# Patient Record
Sex: Female | Born: 1955
Health system: Southern US, Community
[De-identification: ages and names within clinical notes are randomized; demographics above are authoritative.]

## PROBLEM LIST (undated history)

## (undated) DIAGNOSIS — E079 Disorder of thyroid, unspecified: Secondary | ICD-10-CM

## (undated) DIAGNOSIS — G43109 Migraine with aura, not intractable, without status migrainosus: Secondary | ICD-10-CM

## (undated) DIAGNOSIS — E119 Type 2 diabetes mellitus without complications: Secondary | ICD-10-CM

## (undated) DIAGNOSIS — I10 Essential (primary) hypertension: Secondary | ICD-10-CM

## (undated) DIAGNOSIS — E785 Hyperlipidemia, unspecified: Secondary | ICD-10-CM

## (undated) DIAGNOSIS — F419 Anxiety disorder, unspecified: Secondary | ICD-10-CM

## (undated) DIAGNOSIS — F32A Depression, unspecified: Secondary | ICD-10-CM

## (undated) DIAGNOSIS — F329 Major depressive disorder, single episode, unspecified: Secondary | ICD-10-CM

## (undated) HISTORY — DX: Essential (primary) hypertension: I10

## (undated) HISTORY — PX: CHOLECYSTECTOMY: SHX55

## (undated) HISTORY — DX: Major depressive disorder, single episode, unspecified: F32.9

## (undated) HISTORY — DX: Hyperlipidemia, unspecified: E78.5

## (undated) HISTORY — DX: Depression, unspecified: F32.A

## (undated) HISTORY — DX: Migraine with aura, not intractable, without status migrainosus: G43.109

## (undated) HISTORY — DX: Disorder of thyroid, unspecified: E07.9

## (undated) HISTORY — DX: Type 2 diabetes mellitus without complications: E11.9

## (undated) HISTORY — PX: BREAST SURGERY: SHX581

## (undated) HISTORY — DX: Anxiety disorder, unspecified: F41.9

## (undated) HISTORY — PX: THYROIDECTOMY, PARTIAL: SHX18

---

## 2016-01-01 ENCOUNTER — Encounter: Payer: Self-pay | Admitting: Family Medicine

## 2016-01-01 ENCOUNTER — Ambulatory Visit (INDEPENDENT_AMBULATORY_CARE_PROVIDER_SITE_OTHER): Payer: BLUE CROSS/BLUE SHIELD | Admitting: Family Medicine

## 2016-01-01 VITALS — BP 135/70 | HR 83 | Resp 12 | Ht 64.5 in | Wt 291.5 lb

## 2016-01-01 DIAGNOSIS — I10 Essential (primary) hypertension: Secondary | ICD-10-CM | POA: Diagnosis not present

## 2016-01-01 DIAGNOSIS — E559 Vitamin D deficiency, unspecified: Secondary | ICD-10-CM

## 2016-01-01 DIAGNOSIS — Z6841 Body Mass Index (BMI) 40.0 and over, adult: Secondary | ICD-10-CM

## 2016-01-01 DIAGNOSIS — E118 Type 2 diabetes mellitus with unspecified complications: Secondary | ICD-10-CM

## 2016-01-01 DIAGNOSIS — E890 Postprocedural hypothyroidism: Secondary | ICD-10-CM

## 2016-01-01 DIAGNOSIS — F419 Anxiety disorder, unspecified: Secondary | ICD-10-CM

## 2016-01-01 DIAGNOSIS — E1165 Type 2 diabetes mellitus with hyperglycemia: Secondary | ICD-10-CM | POA: Diagnosis not present

## 2016-01-01 DIAGNOSIS — IMO0002 Reserved for concepts with insufficient information to code with codable children: Secondary | ICD-10-CM

## 2016-01-01 DIAGNOSIS — F329 Major depressive disorder, single episode, unspecified: Secondary | ICD-10-CM | POA: Insufficient documentation

## 2016-01-01 DIAGNOSIS — G43109 Migraine with aura, not intractable, without status migrainosus: Secondary | ICD-10-CM

## 2016-01-01 DIAGNOSIS — Z794 Long term (current) use of insulin: Secondary | ICD-10-CM | POA: Diagnosis not present

## 2016-01-01 DIAGNOSIS — E785 Hyperlipidemia, unspecified: Secondary | ICD-10-CM | POA: Diagnosis not present

## 2016-01-01 DIAGNOSIS — G4733 Obstructive sleep apnea (adult) (pediatric): Secondary | ICD-10-CM | POA: Insufficient documentation

## 2016-01-01 DIAGNOSIS — Z9989 Dependence on other enabling machines and devices: Secondary | ICD-10-CM

## 2016-01-01 DIAGNOSIS — F418 Other specified anxiety disorders: Secondary | ICD-10-CM

## 2016-01-01 DIAGNOSIS — F32A Depression, unspecified: Secondary | ICD-10-CM | POA: Insufficient documentation

## 2016-01-01 LAB — LIPID PANEL
Cholesterol: 166 mg/dL (ref 0–200)
HDL: 42.7 mg/dL (ref >39.00)
LDL Cholesterol: 93 mg/dL (ref 0–99)
NonHDL: 123.35
TRIGLYCERIDES: 151 mg/dL — AB (ref 0.0–149.0)
Total CHOL/HDL Ratio: 4
VLDL: 30.2 mg/dL (ref 0.0–40.0)

## 2016-01-01 LAB — CBC
HCT: 36.8 % (ref 36.0–46.0)
HEMOGLOBIN: 12.3 g/dL (ref 12.0–15.0)
MCHC: 33.5 g/dL (ref 30.0–36.0)
MCV: 88.5 fl (ref 78.0–100.0)
PLATELETS: 283 10*3/uL (ref 150.0–400.0)
RBC: 4.16 Mil/uL (ref 3.87–5.11)
RDW: 14.4 % (ref 11.5–15.5)
WBC: 9 10*3/uL (ref 4.0–10.5)

## 2016-01-01 LAB — COMPREHENSIVE METABOLIC PANEL
ALT: 28 U/L (ref 0–35)
AST: 20 U/L (ref 0–37)
Albumin: 4 g/dL (ref 3.5–5.2)
Alkaline Phosphatase: 115 U/L (ref 39–117)
BUN: 12 mg/dL (ref 6–23)
CHLORIDE: 99 meq/L (ref 96–112)
CO2: 28 meq/L (ref 19–32)
Calcium: 9.2 mg/dL (ref 8.4–10.5)
Creatinine, Ser: 0.76 mg/dL (ref 0.40–1.20)
GFR: 82.12 mL/min (ref >60.00)
GLUCOSE: 223 mg/dL — AB (ref 70–99)
POTASSIUM: 4.2 meq/L (ref 3.5–5.1)
Sodium: 135 mEq/L (ref 135–145)
Total Bilirubin: 0.4 mg/dL (ref 0.2–1.2)
Total Protein: 7.2 g/dL (ref 6.0–8.3)

## 2016-01-01 LAB — MICROALBUMIN / CREATININE URINE RATIO
CREATININE, U: 241.3 mg/dL
MICROALB/CREAT RATIO: 1.7 mg/g (ref 0.0–30.0)
Microalb, Ur: 4 mg/dL — ABNORMAL HIGH (ref 0.0–1.9)

## 2016-01-01 LAB — HEMOGLOBIN A1C: Hgb A1c MFr Bld: 10.7 % — ABNORMAL HIGH (ref 4.6–6.5)

## 2016-01-01 LAB — TSH: TSH: 3.76 u[IU]/mL (ref 0.35–4.50)

## 2016-01-01 LAB — VITAMIN D 25 HYDROXY (VIT D DEFICIENCY, FRACTURES): VITD: 16.62 ng/mL — ABNORMAL LOW (ref 30.00–100.00)

## 2016-01-01 LAB — T4, FREE: FREE T4: 0.76 ng/dL (ref 0.60–1.60)

## 2016-01-01 MED ORDER — AMITRIPTYLINE HCL 10 MG PO TABS: 30.0000 mg | ORAL_TABLET | Freq: Every day | ORAL | 5 refills | Status: DC

## 2016-01-01 MED ORDER — CITALOPRAM HYDROBROMIDE 40 MG PO TABS: 40.0000 mg | ORAL_TABLET | Freq: Every day | ORAL | 3 refills | Status: DC

## 2016-01-01 MED ORDER — ATORVASTATIN CALCIUM 40 MG PO TABS: 40.0000 mg | ORAL_TABLET | Freq: Every day | ORAL | 3 refills | Status: DC

## 2016-01-01 MED ORDER — LISINOPRIL-HYDROCHLOROTHIAZIDE 20-12.5 MG PO TABS: 1.0000 | ORAL_TABLET | Freq: Every day | ORAL | 1 refills | Status: DC

## 2016-01-01 NOTE — Patient Instructions (Addendum)
A few things to remember from today's visit:   Uncontrolled type 2 diabetes mellitus with complication, with long-term current use of insulin (HCC) - Plan: Lipid panel, CMP, Hemoglobin A1c, Microalbumin/Creatinine Ratio, Urine  Essential hypertension - Plan: Lipid panel, CMP, CBC, lisinopril-hydrochlorothiazide (PRINZIDE,ZESTORETIC) 20-12.5 MG tablet  Hyperlipidemia - Plan: Lipid panel, CMP, atorvastatin (LIPITOR) 40 MG tablet  Postsurgical hypothyroidism - Plan: TSH, T4, Free, CBC  Anxiety and depression - Plan: citalopram (CELEXA) 40 MG tablet  BMI 45.0-49.9, adult (HCC) - Plan: CMP  Vitamin D deficiency - Plan: VITAMIN D 25 Hydroxy (Vit-D Deficiency, Fractures)  OSA on CPAP - Plan: Ambulatory referral to Pulmonology  Migraine with aura and without status migrainosus, not intractable - Plan: amitriptyline (ELAVIL) 10 MG tablet  HgA1C goal < 7.0. Avoid sugar added food:regular soft drinks, energy drinks, and sports drinks. candy. cakes. cookies. pies and cobblers. sweet rolls, pastries, and donuts. fruit drinks, such as fruitades and fruit punch. dairy desserts, such as ice cream  Mediterranean diet has showed benefits for sugar control.  How much and what type of carbohydrate foods are important for managing diabetes. The balance between how much insulin is in your body and the carbohydrate you eat makes a difference in your blood glucose levels.  Fasting blood sugar ideally 130 or less, 2 hours after meals less than 180.   Regular exercise also will help with controlling disease, daily brisk walking as tolerated for 15-30 min definitively will help.   Avoid skipping meals, blood sugar might drop and cause serious problems. Remember checking feet periodically, good dental hygiene, and annual eye exam.  What are some tips for weight loss?   People become overweight for many reasons. Weight issues can run in families. They can be caused by unhealthy behaviors and a person's  environment. Certain health problems and medicines can also lead to weight gain. There are some simple things you can do to reach and maintain a healthy weight:  Eat small more frequent healthy meals instead 3 bid meals. Also Weight Watchers is a good option. Avoid sweet drinks. These include regular soft drinks, fruit juices, fruit drinks, energy drinks, sweetened iced tea, and flavored milk. Avoid fast foods. Fast foods such as french fries, hamburgers, chicken nuggets, and pizza are high in calories and can cause weight gain. Eat a healthy breakfast. People who skip breakfast tend to weigh more. Don't watch more than two hours of television per day. Chew sugar-free gum between meals to cut down on snacking. Avoid grocery shopping when you're hungry. Pack a healthy lunch instead of eating out to control what and how much you eat. Eat a lot of fruits and vegetables. Aim for about 2 cups of fruit and 2 to 3 cups of vegetables per day. Aim for 150 minutes per week of moderate-intensity exercise (such as brisk walking), or 75 minutes per week of vigorous exercise (such as jogging or running). OR 15-30 min of daily brisk walking. Be more active. Small changes in physical activity can easily be added to your daily routine. For example, take the stairs instead of the elevator. Take a walk with your family. A daily walk is a great way to get exercise and to catch up on the day's events.     Please be sure medication list is accurate. If a new problem present, please set up appointment sooner than planned today.

## 2016-01-01 NOTE — Progress Notes (Signed)
HPI:   [name] is a 60 y.o. female, who is here today to establish care with me.  Former PCP: In [place] Last preventive routine visit: within a year ago. She follows with gyn regularly for her female preventive care and treatment of vulvar lichen sclerotic.  Concerns today: meds refills and labs, she has not eaten anything today.    Diabetes Mellitus II:   Sheis currently on Novolin 45 U am and 35 U pm, Metformin 1000 mg bid, and Glimepiride 4 mg bid. Problem has been stable. She is checking BS's, denies any hypoglycemia.Lower BS 87, rest 200's. She is tolerating medications well. She denies abdominal pain, nausea, vomiting, polydipsia, polyuria, or polyphagia. No numbness or burning but occasional tingling on toes.  Last eye exam 08/2015. Has a dental appt coming.    Hyperlipidemia: She is on Atorvastatin 40 mg daily. Following a low fat diet, not consistently.. She has not noted side effects with medication.    Hypertension:  Years Hx.  Currently she is on Lisinopril-HCTZ. She is taking medications as instructed, no side effects reported.  She has not noted unusual headache, visual changes, exertional chest pain, dyspnea,  focal weakness, or edema.  + OSA on CPAP.  She just started exercising a few months ago, she has a Psychologist, educational. Also recently she started a healthy diet, similar to Weight Watchers, on line couching and attending meetings.  Hypothyroidism:  Currently she is on Levothyroxine 112 mcg daily. S/P partial thyroidectomy dur to thyroid cancer.She has followed with PCP.  Tolerating medication well, no side effects reported. She has not noted dysphagia, palpitations, abdominal pain, changes in bowel habits, tremor, cold/heat intolerance, or abnormal weight loss.  Vit D deficiency on OTC Vit D 1000 U daily. Also reporting Hx of iron deficiency anemia. Colonoscopy 1.5 years ago, due at age 59.   She also has history of migraines with  aura, currently she is on amitriptyline 30 mg daily, which she has taken for about 7 years. She has an episode per year since she's been on the Amitriptyline. Headache is usually associated with photophobia, nausea, vomiting, preceded by visual aura and aphasia.  GERD and esophageal spasms, she is currently on Pantoprazole 40 mg daily, she's been taking it since 1982.    History of anxiety and depression, she denies any history of bipolar disorder or psychotic hospitalization. Currently she is on Citalopram 40 mg daily, which should be taken for 5 years. Denies depressed mood or suicidal thoughts.  FHx negative for bipolar disorder. Son with Hx of anxiety and depression.  She lives with son.  Reporting labs done last about 9 months ago.   Review of Systems  Constitutional: Negative for activity change, appetite change, fatigue, fever and unexpected weight change.  HENT: Negative for dental problem, mouth sores, nosebleeds, sore throat and trouble swallowing.   Eyes: Negative for redness and visual disturbance.  Respiratory: Negative for cough, shortness of breath and wheezing.        + OSA on CPAP. + Smoker.  Cardiovascular: Negative for chest pain, palpitations and leg swelling.  Gastrointestinal: Negative for abdominal pain, nausea and vomiting.       Negative for changes in bowel habits.  Endocrine: Negative for cold intolerance, heat intolerance, polydipsia, polyphagia and polyuria.  Genitourinary: Negative for decreased urine volume, difficulty urinating and hematuria.  Musculoskeletal: Negative for gait problem and myalgias.  Skin: Negative for pallor and rash.  Neurological: Negative for seizures, syncope, weakness, numbness and  headaches.  Psychiatric/Behavioral: Negative for confusion and suicidal ideas. The patient is nervous/anxious.       No current outpatient prescriptions on file prior to visit.   No current facility-administered medications on file prior to  visit.      Past Medical History:  Diagnosis Date  . Anxiety   . Depression   . Diabetes mellitus without complication (HCC)   . Hyperlipidemia   . Hypertension   . Migraine headache with aura   . Thyroid disease    Not on File  Family History  Problem Relation Age of Onset  . Breast cancer Mother   . Stroke Father   . Heart disease Father   . Hyperlipidemia Father   . Cancer - Lung Maternal Grandfather     Social History   Social History  . Marital status: Unknown    Spouse name: N/A  . Number of children: N/A  . Years of education: N/A   Social History Main Topics  . Smoking status: Current Every Day Smoker  . Smokeless tobacco: None  . Alcohol use Yes     Comment: occasional  . Drug use: No  . Sexual activity: Not Asked   Other Topics Concern  . None   Social History Narrative  . None    Vitals:   01/01/16 1355  BP: 135/70  Pulse: 83  Resp: 12    Body mass index is 49.26 kg/m.  O2 sat 97% at [facility].    Physical Exam  Nursing note and vitals reviewed. Constitutional: She is oriented to person, place, and time. She appears well-developed. No distress.  HENT:  Head: Atraumatic.  Mouth/Throat: Oropharynx is clear and moist and mucous membranes are normal.  Eyes: Conjunctivae and EOM are normal. Pupils are equal, round, and reactive to light.  Neck: No thyroid mass and no thyromegaly present.  Cardiovascular: Normal rate and regular rhythm.   No murmur heard. Pulses:      Dorsalis pedis pulses are 2+ on the right side, and 2+ on the left side.  Respiratory: Effort normal and breath sounds normal. No respiratory distress.  GI: Soft. She exhibits no mass. There is no hepatomegaly. There is no tenderness.  Musculoskeletal: She exhibits no edema.  Lymphadenopathy:    She has no cervical adenopathy.  Neurological: She is alert and oriented to person, place, and time. She has normal strength. Coordination and gait normal.  Skin: Skin is warm. No  erythema.  Psychiatric: She has a normal mood and affect.  Well groomed, good eye contact.   Diabetic foot exam:  Monofilament normal bilateral. Peripheral pulses present (DP). No calluses No hypertrophic/long toenails.    ASSESSMENT AND PLAN:     Julie Black was seen today for new patient (initial visit).  Diagnoses and all orders for this visit:  Uncontrolled type 1 diabetes mellitus with complication (HCC)  No changes in current management base on HgA1C I will change Novolin to basal insulin. Rest of meds to continue. Foot, dental, and eye care discussed. F/U in 4 months.  -     Lipid panel -     CMP -     Hemoglobin A1c -     Microalbumin/Creatinine Ratio, Urine  Essential hypertension   Adequately controlled. No changes in current management. DASH/low salt diet recommended. Eye exam recommended annually. F/U in 4 months, before if needed.  -     Lipid panel -     CMP -     CBC -  lisinopril-hydrochlorothiazide (PRINZIDE,ZESTORETIC) 20-12.5 MG tablet; Take 1 tablet by mouth daily.  Hyperlipidemia  No changes in current management, will follow labs done today and will give further recommendations accordingly. Low fat diet. F/U in 6-12 months. -     Lipid panel -     CMP -     atorvastatin (LIPITOR) 40 MG tablet; Take 1 tablet (40 mg total) by mouth daily.  Postsurgical hypothyroidism  No changes in current management, will follow labs done today and will give further recommendations accordingly. F/U in 6-12 months.   -     TSH -     T4, Free -     CBC  Anxiety and depression  Stable. No changes in current management. We may try to decrease dose of Celexa in a few months. F/U in 4  months.  -     citalopram (CELEXA) 40 MG tablet; Take 1 tablet (40 mg total) by mouth daily.  BMI 45.0-49.9, adult (HCC)  We discussed benefits of wt loss as well as adverse effects of obesity. Consistency with healthy diet and physical activity  recommended. Daily brisk walking for 15-30 min as tolerated is a good option if difficulty going to the gym. Continue current diet. F/U in 4 months.  -     CMP  Vitamin D deficiency  No changes in current management, will follow labs done today and will give further recommendations accordingly.  -     VITAMIN D 25 Hydroxy (Vit-D Deficiency, Fractures)  OSA on CPAP -     Ambulatory referral to Pulmonology  Migraine with aura and without status migrainosus, not intractable  Stable. No changes in current management. F/U in 6-12 months.  -     amitriptyline (ELAVIL) 10 MG tablet; Take 3 tablets (30 mg total) by mouth at bedtime.          Dorena Dorfman G. [place], MD  [facility]. [place] office.

## 2016-01-03 ENCOUNTER — Other Ambulatory Visit: Payer: Self-pay

## 2016-01-03 MED ORDER — LEVOTHYROXINE SODIUM 112 MCG PO TABS: 112.0000 ug | ORAL_TABLET | Freq: Every day | ORAL | 1 refills | Status: DC

## 2016-01-03 MED ORDER — INSULIN PEN NEEDLE 32G X 4 MM MISC: 2 refills | Status: DC

## 2016-01-03 MED ORDER — INSULIN GLARGINE 100 UNIT/ML SOLOSTAR PEN: PEN_INJECTOR | SUBCUTANEOUS | 2 refills | Status: DC

## 2016-01-03 MED ORDER — METFORMIN HCL 1000 MG PO TABS: 1000.0000 mg | ORAL_TABLET | Freq: Two times a day (BID) | ORAL | 2 refills | Status: DC

## 2016-01-03 MED ORDER — PANTOPRAZOLE SODIUM 40 MG PO TBEC: 40.0000 mg | DELAYED_RELEASE_TABLET | Freq: Every day | ORAL | 3 refills | Status: DC

## 2016-01-03 MED ORDER — GLIMEPIRIDE 4 MG PO TABS: 4.0000 mg | ORAL_TABLET | Freq: Two times a day (BID) | ORAL | 3 refills | Status: DC

## 2016-02-29 ENCOUNTER — Ambulatory Visit: Payer: Self-pay | Admitting: Internal Medicine

## 2016-03-05 ENCOUNTER — Encounter: Payer: Self-pay | Admitting: Pulmonary Disease

## 2016-03-05 ENCOUNTER — Ambulatory Visit (INDEPENDENT_AMBULATORY_CARE_PROVIDER_SITE_OTHER): Payer: BLUE CROSS/BLUE SHIELD | Admitting: Pulmonary Disease

## 2016-03-05 DIAGNOSIS — G4733 Obstructive sleep apnea (adult) (pediatric): Secondary | ICD-10-CM | POA: Diagnosis not present

## 2016-03-05 DIAGNOSIS — Z9989 Dependence on other enabling machines and devices: Secondary | ICD-10-CM

## 2016-03-05 NOTE — Assessment & Plan Note (Signed)
Please ask your previous DME to send us a copy of her sleep study & report on your CPAP machine   We will set you up with new DMEand provide you with CPAP supplies  Weight loss encouraged, compliance with goal of at least 4-6 hrs every night is the expectation. Advised against medications with sedative side effects Cautioned against driving when sleepy - understanding that sleepiness will vary on a day to day basis

## 2016-03-05 NOTE — Progress Notes (Signed)
Subjective:    Patient ID: Julie Black, female    DOB: Jan 09, 1956, 60 y.o.   MRN: [id number]  HPI  Chief Complaint  Patient presents with  . Advice Only    Sleep consult referred by Dr. Martin[place].  Pt currently on cpap.  Epworth score: 61.     60 year old smoker  Presents to establish care for OSA. SHe was diagnosed many years ago in [place], [place] by an overnight polysomnogram and has been maintained on CPAP 10 cm with a full face mask since then. She is on her second machine for the last 6 years. She has just moved to  [facility] and presents to establish care. 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  Epworth sleep score is 5 and she denies excessive daytime  Somnolence. No snoring has been  By family members while on CPAP>  Bedtime is around midnight sleep latency is minimal-she occasionally has bouts of insomnoia, she sleeps on her right side with 2 pillows, reports one to 2 nocturnal awakenings and is out of bed by 8 AM feeling rested ,with occasional dryness of mouth in spite off using herr humidifier HER-2 dogs sleep with her in bed.  There is no history suggestive of cataplexy, sleep paralysis or parasomnias   she denies problems with mask or pressure She would like to establish with a  New DME to his obtained supplies -download report is not available at this time       Past Medical History:  Diagnosis Date  . Anxiety   . Depression   . Diabetes mellitus without complication ([place])   . Hyperlipidemia   . Hypertension   . Migraine headache with aura   . Thyroid disease      Past Surgical History:  Procedure Laterality Date  . BREAST SURGERY    . CHOLECYSTECTOMY    . THYROIDECTOMY, PARTIAL Right      No Known Allergies   Social History   Social History  . Marital status: Widowed    Spouse name: N/A  . Number of children: N/A  . Years of education: N/A   Occupational History  . Not on file.    Social History Main Topics  . Smoking status: Current Every Day Smoker    Packs/day: 0.25    Years: 3.00    Types: Cigarettes  . Smokeless tobacco: Never Used  . Alcohol use Yes     Comment: occasional  . Drug use: No  . Sexual activity: Not on file   Other Topics Concern  . Not on file   Social History Narrative  . No narrative on file     Family History  Problem Relation Age of Onset  . Breast cancer Mother   . Stroke Father   . Heart disease Father   . Hyperlipidemia Father   . Cancer - Lung Maternal Grandfather      Review of Systems  Constitutional: Positive for fatigue. Negative for fever and unexpected weight change.  HENT: Negative for congestion, dental problem, ear pain, nosebleeds, postnasal drip, rhinorrhea, sinus pressure, sneezing, sore throat and trouble swallowing.   Eyes: Negative for redness and itching.  Respiratory: Negative for cough, chest tightness, shortness of breath and wheezing.   Cardiovascular: Negative for palpitations and leg swelling.  Gastrointestinal: Negative for nausea and vomiting.  Genitourinary: Negative for dysuria.  Musculoskeletal: Negative for joint swelling.  Skin: Negative for rash.  Neurological: Negative for headaches.  Hematological: Does  not bruise/bleed easily.  Psychiatric/Behavioral: Negative for dysphoric mood. The patient is not nervous/anxious.        Objective:   Physical Exam  Gen. Pleasant, obese, in no distress, normal affect ENT - no lesions, no post nasal drip, class 2-3 airway Neck: No JVD, no thyromegaly, no carotid bruits Lungs: no use of accessory muscles, no dullness to percussion, decreased without rales or rhonchi  Cardiovascular: Rhythm regular, heart sounds  normal, no murmurs or gallops, no peripheral edema Abdomen: soft and non-tender, no hepatosplenomegaly, BS normal. Musculoskeletal: No deformities, no cyanosis or clubbing Neuro:  alert, non focal, no tremors       Assessment &  Plan:

## 2016-03-05 NOTE — Patient Instructions (Signed)
Please ask your previous DME to send us a copy of her sleep study & report on your CPAP machine   We will set you up with new DMEand provide you with CPAP supplies

## 2016-03-08 ENCOUNTER — Telehealth: Payer: Self-pay | Admitting: Pulmonary Disease

## 2016-03-08 NOTE — Telephone Encounter (Signed)
Spoke with pt. She is wanting us to call her former doctor to get the records RA is requesting. Advised her that they are going to require a records release from her. States that she is going to be faxing this over.

## 2016-03-19 ENCOUNTER — Telehealth: Payer: Self-pay | Admitting: Pulmonary Disease

## 2016-03-19 NOTE — Telephone Encounter (Signed)
Review of records 04/2010 PSG moderate OSA with RDI 19/hour lowest desaturation 83% 04/2010 CPAP Average pressure 16 cm on auto CPAP downloads

## 2016-04-01 ENCOUNTER — Encounter: Payer: Self-pay | Admitting: Pulmonary Disease

## 2016-04-18 ENCOUNTER — Telehealth: Payer: Self-pay | Admitting: Family Medicine

## 2016-04-18 MED ORDER — INSULIN GLARGINE 100 UNIT/ML SOLOSTAR PEN: PEN_INJECTOR | SUBCUTANEOUS | 2 refills | Status: DC

## 2016-04-18 NOTE — Telephone Encounter (Signed)
Rx sent 

## 2016-04-18 NOTE — Telephone Encounter (Signed)
Pt need new Rx for Lantus    Pharm:  [facility]  [employer]

## 2016-05-02 ENCOUNTER — Ambulatory Visit: Payer: BLUE CROSS/BLUE SHIELD | Admitting: Family Medicine

## 2016-05-17 ENCOUNTER — Ambulatory Visit (INDEPENDENT_AMBULATORY_CARE_PROVIDER_SITE_OTHER): Payer: BLUE CROSS/BLUE SHIELD | Admitting: Family Medicine

## 2016-05-17 ENCOUNTER — Encounter: Payer: Self-pay | Admitting: Family Medicine

## 2016-05-17 VITALS — BP 120/70 | HR 86 | Resp 12 | Ht 64.5 in | Wt 293.2 lb

## 2016-05-17 DIAGNOSIS — E108 Type 1 diabetes mellitus with unspecified complications: Secondary | ICD-10-CM | POA: Diagnosis not present

## 2016-05-17 DIAGNOSIS — I10 Essential (primary) hypertension: Secondary | ICD-10-CM | POA: Diagnosis not present

## 2016-05-17 DIAGNOSIS — E1065 Type 1 diabetes mellitus with hyperglycemia: Secondary | ICD-10-CM | POA: Diagnosis not present

## 2016-05-17 DIAGNOSIS — F418 Other specified anxiety disorders: Secondary | ICD-10-CM

## 2016-05-17 DIAGNOSIS — IMO0002 Reserved for concepts with insufficient information to code with codable children: Secondary | ICD-10-CM

## 2016-05-17 DIAGNOSIS — F329 Major depressive disorder, single episode, unspecified: Secondary | ICD-10-CM

## 2016-05-17 DIAGNOSIS — Z23 Encounter for immunization: Secondary | ICD-10-CM

## 2016-05-17 DIAGNOSIS — R748 Abnormal levels of other serum enzymes: Secondary | ICD-10-CM

## 2016-05-17 DIAGNOSIS — F419 Anxiety disorder, unspecified: Secondary | ICD-10-CM

## 2016-05-17 DIAGNOSIS — E559 Vitamin D deficiency, unspecified: Secondary | ICD-10-CM

## 2016-05-17 DIAGNOSIS — F32A Depression, unspecified: Secondary | ICD-10-CM

## 2016-05-17 DIAGNOSIS — Z6841 Body Mass Index (BMI) 40.0 and over, adult: Secondary | ICD-10-CM

## 2016-05-17 LAB — COMPREHENSIVE METABOLIC PANEL
ALK PHOS: 108 U/L (ref 39–117)
ALT: 27 U/L (ref 0–35)
AST: 18 U/L (ref 0–37)
Albumin: 3.8 g/dL (ref 3.5–5.2)
BILIRUBIN TOTAL: 0.4 mg/dL (ref 0.2–1.2)
BUN: 14 mg/dL (ref 6–23)
CALCIUM: 8.9 mg/dL (ref 8.4–10.5)
CO2: 32 meq/L (ref 19–32)
CREATININE: 0.77 mg/dL (ref 0.40–1.20)
Chloride: 99 mEq/L (ref 96–112)
GFR: 80.79 mL/min (ref >60.00)
Glucose, Bld: 213 mg/dL — ABNORMAL HIGH (ref 70–99)
Potassium: 3.8 mEq/L (ref 3.5–5.1)
Sodium: 136 mEq/L (ref 135–145)
TOTAL PROTEIN: 6.9 g/dL (ref 6.0–8.3)

## 2016-05-17 LAB — VITAMIN D 25 HYDROXY (VIT D DEFICIENCY, FRACTURES): VITD: 17.09 ng/mL — ABNORMAL LOW (ref 30.00–100.00)

## 2016-05-17 LAB — HEMOGLOBIN A1C: Hgb A1c MFr Bld: 12.2 % — ABNORMAL HIGH (ref 4.6–6.5)

## 2016-05-17 MED ORDER — CITALOPRAM HYDROBROMIDE 20 MG PO TABS: 30.0000 mg | ORAL_TABLET | Freq: Every day | ORAL | 2 refills | Status: DC

## 2016-05-17 NOTE — Progress Notes (Signed)
Pre visit review using our clinic review tool, if applicable. No additional management support is needed unless otherwise documented below in the visit note. 

## 2016-05-17 NOTE — Progress Notes (Addendum)
HPI:   Ms.Akiya Meleni Delahunt is a 61 y.o. female, who is here today to follow on some of her chronic medical problems.  Last OV 01/01/16.  Hypertension: Currently she is on Lisinopril-HCTZ 20-12.5 mg daily. Reporting no side effects from medication. She denies any frequent/severe headache, visual changes, chest pain, dyspnea, palpitation, abdominal pain, nausea, vomiting, or edema.  She is not checking BP at home.   Diabetes Mellitus II:  Currently on Lantus 50 U daily, Amaryl 4 mg bid, and Metformin 1000 mg bid. Problem has been stable. Checking BS's : FG and post prandial 200's. Hypoglycemia: No BS's < 70 but reports having "shaking" sensation when BS < 90, she had one episode since last OV and BS was 87.   She is tolerating medications well. She denies abdominal pain, nausea, vomiting, polydipsia, polyuria, or polyphagia. "Little tingling on toes. No numbness or burning.  Last eye exam within the past year.   Lab Results  Component Value Date   CREATININE 0.76 01/01/2016   BUN 12 01/01/2016   NA 135 01/01/2016   K 4.2 01/01/2016   CL 99 01/01/2016   CO2 28 01/01/2016    She is on Aspirin 81 mg and Lipitor.    Lab Results  Component Value Date   HGBA1C 10.7 (H) 01/01/2016   Lab Results  Component Value Date   MICROALBUR 4.0 (H) 01/01/2016    Anxiety and depression: "Good", Currently she is on Celexa 40 mg daily, which she has taken for about a few years. 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 She denies episodes of anxiety, depressed mood, or suicidal thoughts.  She takes Amitriptyline 10 mg 3 tabs at bedtime for migraine management.  Reviewing labs done on 03/19/15,  Alk phosp elevated at 123.  She started exercising 05/14/16. She has not followed a healthy diet "lately", since 02/2016.  Vitamin D deficiency:  Currently she is on OTC vitamin D 5000 units daily. She is complaining of some  fatigue, which is no more than usual.  Still smoking but working on smoking cessation.   Review of Systems  Constitutional: Positive for fatigue (no more than usual). Negative for activity change, appetite change, fever and unexpected weight change.  HENT: Negative for mouth sores, nosebleeds and trouble swallowing.   Eyes: Negative for pain and visual disturbance.  Respiratory: Negative for cough, shortness of breath and wheezing.   Cardiovascular: Negative for chest pain, palpitations and leg swelling.  Gastrointestinal: Negative for abdominal pain, nausea and vomiting.       Negative for changes in bowel habits.  Endocrine: Negative for polydipsia, polyphagia and polyuria.  Genitourinary: Negative for decreased urine volume, difficulty urinating and hematuria.  Musculoskeletal: Negative for gait problem and myalgias.  Neurological: Negative for syncope, weakness, numbness and headaches.  Psychiatric/Behavioral: Negative for confusion and suicidal ideas. The patient is not nervous/anxious.       Current Outpatient Prescriptions on File Prior to Visit  Medication Sig Dispense Refill  . amitriptyline (ELAVIL) 10 MG tablet Take 3 tablets (30 mg total) by mouth at bedtime. 90 tablet 5  . atorvastatin (LIPITOR) 40 MG tablet Take 1 tablet (40 mg total) by mouth daily. 90 tablet 3  . Clobetasol Prop Emollient Base (CLOBETASOL PROPIONATE E) 0.05 % emollient cream Apply topically.    Marland Kitchen glimepiride (AMARYL) 4 MG tablet Take 1 tablet (4 mg total) by mouth 2 (two) times daily. 90 tablet 3  .  Insulin Glargine (LANTUS) 100 UNIT/ML Solostar Pen Inject 50 Units into the skin at the same time every day. 15 mL 2  . Insulin Pen Needle 32G X 4 MM MISC Use with lantus pen. 100 each 2  . levothyroxine (SYNTHROID, LEVOTHROID) 112 MCG tablet Take 1 tablet (112 mcg total) by mouth daily before breakfast. 90 tablet 1  . lisinopril-hydrochlorothiazide (PRINZIDE,ZESTORETIC) 20-12.5 MG tablet Take 1 tablet by  mouth daily. 90 tablet 1  . metFORMIN (GLUCOPHAGE) 1000 MG tablet Take 1 tablet (1,000 mg total) by mouth 2 (two) times daily with a meal. 180 tablet 2  . pantoprazole (PROTONIX) 40 MG tablet Take 1 tablet (40 mg total) by mouth daily. 90 tablet 3   No current facility-administered medications on file prior to visit.      Past Medical History:  Diagnosis Date  . Anxiety   . Depression   . Diabetes mellitus without complication ([place])   . Hyperlipidemia   . Hypertension   . Migraine headache with aura   . Thyroid disease    No Known Allergies  Social History   Social History  . Marital status: Widowed    Spouse name: N/A  . Number of children: N/A  . Years of education: N/A   Social History Main Topics  . Smoking status: Current Every Day Smoker    Packs/day: 0.25    Years: 3.00    Types: Cigarettes  . Smokeless tobacco: Never Used  . Alcohol use Yes     Comment: occasional  . Drug use: No  . Sexual activity: Not Asked   Other Topics Concern  . None   Social History Narrative  . None    Vitals:   05/17/16 1006  BP: 120/70  Pulse: 86  Resp: 12   O2 sat at RA 95%  Body mass index is 49.56 kg/m.  Wt Readings from Last 3 Encounters:  05/17/16 293 lb 4 oz (133 kg)  03/05/16 294 lb (133.4 kg)  01/01/16 291 lb 8 oz (132.2 kg)      Physical Exam  Nursing note and vitals reviewed. Constitutional: She is oriented to person, place, and time. She appears well-developed. No distress.  HENT:  Head: Atraumatic.  Mouth/Throat: Oropharynx is clear and moist and mucous membranes are normal.  Eyes: Conjunctivae and EOM are normal. Pupils are equal, round, and reactive to light.  Cardiovascular: Normal rate and regular rhythm.   No murmur heard. Pulses:      Dorsalis pedis pulses are 2+ on the right side, and 2+ on the left side.  Respiratory: Effort normal and breath sounds normal. No respiratory distress.  GI: Soft. She exhibits no mass. There is no  hepatomegaly. There is no tenderness.  Musculoskeletal: She exhibits edema (pitting trace LE edema, bilateral.). She exhibits no tenderness.  Neurological: She is alert and oriented to person, place, and time. She has normal strength. Coordination and gait normal.  Skin: Skin is warm. No erythema.  Psychiatric: She has a normal mood and affect. Cognition and memory are normal.  Well groomed, good eye contact.   Diabetic foot exam:  Monofilament normal bilateral. Peripheral pulses present (DP). No calluses No hypertrophic/long toenails.    ASSESSMENT AND PLAN:     Aleza was seen today for follow-up.  Diagnoses and all orders for this visit:  Uncontrolled type 1 diabetes mellitus with complication ([place])   [id number] has not been at goal. No changes in current management .treatment will be adjusted according to lab results.  We discussed possible complications of poorly controlled DM 2. Regular exercise and healthy diet with avoidance of added sugar food intake is an important part of treatment and recommended. Annual eye exam, periodic dental and foot care recommended. Pnemovax reported, 2 years ago. F/U in 4 months  -     Hemoglobin A1c -     Comprehensive metabolic panel  Essential hypertension  Adequately controlled. No changes in current management. DASH-low salt diet recommended. Eye exam recommended annually. F/U in 4 months, before if needed.  -     Comprehensive metabolic panel  Vitamin D deficiency  No changes in current management, will follow labs done today and will give further recommendations accordingly.  -     VITAMIN D 25 Hydroxy (Vit-D Deficiency, Fractures) -     Parathyroid hormone, intact (no Ca)  Anxiety and depression  Asymptomatic. She agrees with trying to wean off Celexa, so dose was decreased from 40 mg to 30 mg daily. In about 4 weeks she could further decrease dose from 30 mg to 20 mg as tolerated. Instructed about warning signs.  -      citalopram (CELEXA) 20 MG tablet; Take 1.5 tablets (30 mg total) by mouth daily.  BMI 45.0-49.9, adult ([place])  Wt otherwise stable. We discussed benefits of wt loss as well as adverse effects of obesity. Consistency with healthy diet and physical activity recommended.   Elevated alkaline phosphatase level  Mild and asymptomatic. Further recommendations would be given according to lab results.  -     Comprehensive metabolic panel  Need for prophylactic vaccination and inoculation against influenza -     Flu Vaccine QUAD 36+ mos IM       -Ms. Clayton Bibles was advised to return sooner than planned today if new concerns arise.       Srishti Strnad G. [place], MD  [facility] [place] office.

## 2016-05-17 NOTE — Patient Instructions (Addendum)
A few things to remember from today's visit:   Uncontrolled type 1 diabetes mellitus with complication (HCC) - Plan: Hemoglobin A1c, Comprehensive metabolic panel  Essential hypertension - Plan: Comprehensive metabolic panel  Vitamin D deficiency - Plan: VITAMIN D 25 Hydroxy (Vit-D Deficiency, Fractures), Parathyroid hormone, intact (no Ca)  Anxiety and depression - Plan: citalopram (CELEXA) 20 MG tablet  BMI 45.0-49.9, adult (HCC)  Elevated alkaline phosphatase level - Plan: Comprehensive metabolic panel  HgA1C goal < 7.0. Avoid sugar added food:regular soft drinks, energy drinks, and sports drinks. candy. cakes. cookies. pies and cobblers. sweet rolls, pastries, and donuts. fruit drinks, such as fruitades and fruit punch. dairy desserts, such as ice cream  Mediterranean diet has showed benefits for sugar control.  How much and what type of carbohydrate foods are important for managing diabetes. The balance between how much insulin is in your body and the carbohydrate you eat makes a difference in your blood glucose levels.  Fasting blood sugar ideally 130 or less, 2 hours after meals less than 180.   Regular exercise also will help with controlling disease, daily brisk walking as tolerated for 15-30 min definitively will help.   Avoid skipping meals, blood sugar might drop and cause serious problems. Remember checking feet periodically, good dental hygiene, and annual eye exam.      Please be sure medication list is accurate. If a new problem present, please set up appointment sooner than planned today.

## 2016-05-21 LAB — PARATHYROID HORMONE, INTACT (NO CA): PTH: 38 pg/mL (ref 14–64)

## 2016-06-04 ENCOUNTER — Telehealth: Payer: Self-pay

## 2016-06-04 ENCOUNTER — Other Ambulatory Visit: Payer: Self-pay

## 2016-06-04 MED ORDER — SITAGLIPTIN PHOSPHATE 100 MG PO TABS: 100.0000 mg | ORAL_TABLET | Freq: Every day | ORAL | 3 refills | Status: DC

## 2016-06-04 NOTE — Telephone Encounter (Signed)
Patient called to let us know that the Alma FriendlyJanuvia is going to cost her $400 with her insurance. What strength of the Jardiance do you want to send in? Or is there something different?

## 2016-06-05 ENCOUNTER — Other Ambulatory Visit: Payer: Self-pay | Admitting: Family Medicine

## 2016-06-05 DIAGNOSIS — E108 Type 1 diabetes mellitus with unspecified complications: Principal | ICD-10-CM

## 2016-06-05 DIAGNOSIS — IMO0002 Reserved for concepts with insufficient information to code with codable children: Secondary | ICD-10-CM

## 2016-06-05 DIAGNOSIS — E1065 Type 1 diabetes mellitus with hyperglycemia: Secondary | ICD-10-CM

## 2016-06-05 MED ORDER — EMPAGLIFLOZIN 25 MG PO TABS: 25.0000 mg | ORAL_TABLET | Freq: Every day | ORAL | 4 refills | Status: DC

## 2016-06-05 NOTE — Telephone Encounter (Signed)
Jardiance 25 mg sent to pharmacy. She can start 1/2 tab and increase to whole tab in 1-2 weeks if well tolerated.  Thanks, BJ

## 2016-06-05 NOTE — Telephone Encounter (Signed)
She could check with pharmacists or her health insurance about [place] PepperJardiance Or Invokana or Marcelline DeistFarxiga; these are all same family. She could also find out medications for diabetes covered by her insurance.  If we can not add an oral agent we need to consider adding meals insulin (to take before meals).  Thanks, BJ

## 2016-06-05 NOTE — Telephone Encounter (Signed)
I found the formulary online. Invokana and Jardiance are both on the formulary.

## 2016-06-06 MED ORDER — EMPAGLIFLOZIN 25 MG PO TABS: ORAL_TABLET | ORAL | 4 refills | Status: DC

## 2016-06-06 NOTE — Telephone Encounter (Signed)
Rx sent in

## 2016-07-23 ENCOUNTER — Telehealth: Payer: Self-pay | Admitting: Family Medicine

## 2016-07-23 DIAGNOSIS — I10 Essential (primary) hypertension: Secondary | ICD-10-CM

## 2016-07-23 NOTE — Telephone Encounter (Signed)
It is on her med list,so it is Ok to send a Rx with no refills.We can address this next OV. Thanks, BJ

## 2016-07-23 NOTE — Telephone Encounter (Signed)
Pt need new Rx for Clobetasol Prop Emollient Base and lisinopril-hydrochlorothiazide  Pharm:  [facility] [address]   Pt state that her L pinkey toe was broken so where around January 25th in [place] kept it taped for 6 weeks per the doctor that she saw in [place] and it is doing well now.

## 2016-07-23 NOTE — Telephone Encounter (Signed)
Okay to refill cream  

## 2016-07-24 MED ORDER — CLOBETASOL PROP EMOLLIENT BASE 0.05 % EX CREA: TOPICAL_CREAM | CUTANEOUS | 0 refills | Status: DC

## 2016-07-24 MED ORDER — LISINOPRIL-HYDROCHLOROTHIAZIDE 20-12.5 MG PO TABS: 1.0000 | ORAL_TABLET | Freq: Every day | ORAL | 1 refills | Status: DC

## 2016-07-24 NOTE — Telephone Encounter (Signed)
Rxs sent

## 2016-07-24 NOTE — Addendum Note (Signed)
Addended by: Marcell AngerSELF, SARAH E on: 07/24/2016 10:22 AM   Modules accepted: Orders

## 2016-08-13 ENCOUNTER — Other Ambulatory Visit: Payer: Self-pay | Admitting: Family Medicine

## 2016-09-13 ENCOUNTER — Ambulatory Visit: Payer: BLUE CROSS/BLUE SHIELD | Admitting: Family Medicine

## 2016-09-16 ENCOUNTER — Ambulatory Visit: Payer: BLUE CROSS/BLUE SHIELD | Admitting: Family Medicine

## 2016-09-27 ENCOUNTER — Other Ambulatory Visit: Payer: Self-pay | Admitting: Family Medicine

## 2016-10-15 ENCOUNTER — Ambulatory Visit: Payer: BLUE CROSS/BLUE SHIELD | Admitting: Family Medicine

## 2016-11-19 ENCOUNTER — Other Ambulatory Visit: Payer: Self-pay | Admitting: Family Medicine

## 2016-11-19 DIAGNOSIS — G43109 Migraine with aura, not intractable, without status migrainosus: Secondary | ICD-10-CM

## 2016-12-16 ENCOUNTER — Other Ambulatory Visit: Payer: Self-pay | Admitting: Family Medicine

## 2016-12-16 MED ORDER — INSULIN PEN NEEDLE 32G X 4 MM MISC: 2 refills | Status: DC

## 2016-12-16 NOTE — Telephone Encounter (Signed)
Rx's sent in. °

## 2016-12-16 NOTE — Telephone Encounter (Signed)
Pt need new Rx for LANTUS SOLOSTAR and Insulin Pen Needle 32G x 4 MM Misc  [facility] [address]

## 2016-12-17 NOTE — Progress Notes (Signed)
HPI:   [name] is a 61 y.o. female, who is here today for follow up.   She was last seen on 05/17/16.Did not keep and did not show to 3 scheduled OV's.  DM II:  Currently she is on Lantus 50 U daily,Amaryl 4 mg bid, and Metformin 1000 mg bid. Last OV Jardiance was recommended, she could not afford it.  05/17/16 HgA1C 12.2.   Problem has been poorly controlled.  Checking BS's : "not great" FG 100's-140's and post prandial 200-300's Hypoglycemia: Reports a BS 85 as "low BS", shaky sensation every time BS < 90. Last eye exam 6 months ago, next one next week, ":spot" in "back of eye", denies retinopathy.  She is tolerating medications well.  She denies abdominal pain, nausea, vomiting, polydipsia, polyuria, or polyphagia. Has had intermittent tingling sensation 4th-5th toe left foot sine 05/2016 after toe fracture.    Hypertension:    Currently on Lisinopril-HCTZ 20-12.5 mg daily.  She is not checking BP's at home.  She is taking medications as instructed, no side effects reported.  She has not noted unusual headache, visual changes, exertional chest pain, dyspnea,  focal weakness, or worsening LE edema.     Ref Range & Units 73mo ago 71mo ago    Sodium 135 - 145 mEq/L 136  135    Potassium 3.5 - 5.1 mEq/L 3.8  4.2    Chloride 96 - 112 mEq/L 99  99    CO2 19 - 32 mEq/L 32  28    Glucose, Bld 70 - 99 mg/dL 409213   [id number]     BUN 6 - 23 mg/dL 14  12    Creatinine, Ser 0.40 - 1.20 mg/dL 9.140.77  [phone]    Total Bilirubin 0.2 - 1.2 mg/dL 0.4  0.4    Alkaline Phosphatase 39 - 117 U/L 108  115    AST 0 - 37 U/L 18  20    ALT 0 - 35 U/L 27  28    Total Protein 6.0 - 8.3 g/dL 6.9  7.2    Albumin 3.5 - 5.2 g/dL 3.8  4.0    Calcium 8.4 - 10.5 mg/dL 8.9  9.2    GFR       Anxiety and depression:  Currently on Celexa, which was decreased from 40 mg to 30 mg. Initially she felt little emotional but now she is doing fine on current dose. She denies depressed mood  or suicidal thoughts.  Takes Amitriptyline 30 mg at bedtime for migraines.   Hyperlipidemia:  Currently on Lipitor 40 mg daily. Following a low fat diet: Not consistently.  She has not noted side effects with medication.  Lab Results  Component Value Date   CHOL 166 01/01/2016   HDL 42.70 01/01/2016   LDLCALC 93 01/01/2016   TRIG 151.0 (H) 01/01/2016   CHOLHDL 4 01/01/2016   Vit D deficiency, currently she is not on Vit D as recommended. 25 OH vit D 17.09 05/17/16.   Review of Systems  Constitutional: Positive for fatigue. Negative for activity change, appetite change, fever and unexpected weight change.  HENT: Negative for mouth sores, nosebleeds, sore throat and trouble swallowing.   Eyes: Negative for redness and visual disturbance.  Respiratory: Negative for cough, shortness of breath and wheezing.   Cardiovascular: Negative for chest pain, palpitations and leg swelling.  Gastrointestinal: Negative for abdominal pain, nausea and vomiting.       Negative for changes in bowel  habits.  Endocrine: Negative for polydipsia, polyphagia and polyuria.  Genitourinary: Negative for decreased urine volume, dysuria and hematuria.  Musculoskeletal: Negative for gait problem and myalgias.  Skin: Negative for rash and wound.  Neurological: Negative for syncope, weakness and headaches.  Psychiatric/Behavioral: Negative for confusion and suicidal ideas. The patient is nervous/anxious.       Current Outpatient Prescriptions on File Prior to Visit  Medication Sig Dispense Refill  . amitriptyline (ELAVIL) 10 MG tablet TAKE THREE TABLETS BY MOUTH AT BEDTIME 270 tablet 1  . atorvastatin (LIPITOR) 40 MG tablet Take 1 tablet (40 mg total) by mouth daily. 90 tablet 3  . citalopram (CELEXA) 20 MG tablet Take 1.5 tablets (30 mg total) by mouth daily. 45 tablet 2  . CLOBETASOL PROPIONATE E 0.05 % emollient cream APPLY  CREAM TOPICALLY TO AFFECTED AREA AS DIRECTED 45 g 0  . glimepiride (AMARYL) 4  MG tablet Take 1 tablet (4 mg total) by mouth 2 (two) times daily. 90 tablet 3  . Insulin Pen Needle 32G X 4 MM MISC Use with lantus pen. 100 each 2  . LANTUS SOLOSTAR 100 UNIT/ML Solostar Pen INJECT 50 UNITS SUBCUTANEOUSLY ONCE DAILY AT  THE  SAME  TIME  EACH  DAY 45 mL 1  . levothyroxine (SYNTHROID, LEVOTHROID) 112 MCG tablet TAKE ONE TABLET BY MOUTH ONCE DAILY BEFORE BREAKFAST 90 tablet 0  . lisinopril-hydrochlorothiazide (PRINZIDE,ZESTORETIC) 20-12.5 MG tablet Take 1 tablet by mouth daily. 90 tablet 1  . metFORMIN (GLUCOPHAGE) 1000 MG tablet Take 1 tablet (1,000 mg total) by mouth 2 (two) times daily with a meal. 180 tablet 2  . pantoprazole (PROTONIX) 40 MG tablet Take 1 tablet (40 mg total) by mouth daily. 90 tablet 3   No current facility-administered medications on file prior to visit.      Past Medical History:  Diagnosis Date  . Anxiety   . Depression   . Diabetes mellitus without complication (HCC)   . Hyperlipidemia   . Hypertension   . Migraine headache with aura   . Thyroid disease    No Known Allergies  Social History   Social History  . Marital status: Widowed    Spouse name: N/A  . Number of children: N/A  . Years of education: N/A   Social History Main Topics  . Smoking status: Current Every Day Smoker    Packs/day: 0.25    Years: 3.00    Types: Cigarettes  . Smokeless tobacco: Never Used  . Alcohol use Yes     Comment: occasional  . Drug use: No  . Sexual activity: Not Asked   Other Topics Concern  . None   Social History Narrative  . None    Vitals:   12/21/16 2306  BP: 130/74  Pulse: 96  Resp: 12  SpO2: 97%   Body mass index is 47.88 kg/m. Wt Readings from Last 3 Encounters:  12/21/16 283 lb 5 oz (128.5 kg)  05/17/16 293 lb 4 oz (133 kg)  03/05/16 294 lb (133.4 kg)     Physical Exam  Nursing note and vitals reviewed. Constitutional: She is oriented to person, place, and time. She appears well-developed. No distress.  HENT:    Head: Normocephalic and atraumatic.  Mouth/Throat: Oropharynx is clear and moist and mucous membranes are normal.  Eyes: Pupils are equal, round, and reactive to light. Conjunctivae and EOM are normal.  Neck: No tracheal deviation present. No thyroid mass and no thyromegaly present.  Cardiovascular: Normal rate and regular  rhythm.   No murmur heard. Pulses:      Dorsalis pedis pulses are 2+ on the right side, and 2+ on the left side.  Respiratory: Effort normal and breath sounds normal. No respiratory distress.  GI: Soft. She exhibits no mass. There is no hepatomegaly. There is no tenderness.  Musculoskeletal: She exhibits edema (Trace pitting LE edema,bilateral.). She exhibits no tenderness.  Lymphadenopathy:    She has no cervical adenopathy.  Neurological: She is alert and oriented to person, place, and time. She has normal strength. Coordination normal.  Skin: Skin is warm. No erythema.  Psychiatric: She has a normal mood and affect.  Well groomed, good eye contact.   Diabetic Foot Exam - Simple   Simple Foot Form Diabetic Foot exam was performed with the following findings:  Yes 12/18/2016 11:12 PM  Visual Inspection No deformities, no ulcerations, no other skin breakdown bilaterally:  Yes Sensation Testing Intact to touch and monofilament testing bilaterally:  Yes Pulse Check Posterior Tibialis and Dorsalis pulse intact bilaterally:  Yes Comments     ASSESSMENT AND PLAN:   Ms. Julie Black was seen today for follow-up.   Diagnoses and all orders for this visit:  Lab Results  Component Value Date   HGBA1C 12.4 (H) 12/19/2016   Lab Results  Component Value Date   CREATININE 0.94 12/19/2016   BUN 15 12/19/2016   NA 135 12/19/2016   K 4.3 12/19/2016   CL 98 12/19/2016   CO2 29 12/19/2016    Uncontrolled type 2 diabetes mellitus with hyperglycemia, with long-term current use of insulin (HCC)  Poorly controlled. Some complications from poorly  controlled DM discussed.  Will adjust medications according to HgA1C result. Regular exercise and healthy diet with avoidance of added sugar food intake is an important part of treatment and recommended. Strongly encouraged smoking cessation.  Annual eye exam, periodic dental and foot care recommended. F/U in 4-5 months  -     Hemoglobin A1c  BMI 45.0-49.9, adult (HCC)  Lost about 10 Lb sine 05/2016. We discussed benefits of wt loss as well as adverse effects of obesity. Consistency with healthy diet and physical activity recommended. Daily brisk walking for 15-30 min as tolerated.  Essential hypertension  Adequately controlled. No changes in current management. DASH-low salt diet recommended. Eye exam recommended annually. F/U in 4-5 months, before if needed.  -     Basic metabolic panel  Hyperlipidemia, unspecified hyperlipidemia type  She is not fasting today. No changes in current management. Will plan on FLP next OV.   Vitamin D deficiency  OTC Vit D 5000 U daily recommended. Will plan on checking next OV.   -Ms. Julie Black was advised to return sooner than planned today if new concerns arise.       Julie G. [place], MD  [facility]. [place] office.

## 2016-12-18 ENCOUNTER — Ambulatory Visit (INDEPENDENT_AMBULATORY_CARE_PROVIDER_SITE_OTHER): Payer: Self-pay | Admitting: Family Medicine

## 2016-12-18 VITALS — BP 130/74 | HR 96 | Resp 12 | Wt 283.3 lb

## 2016-12-18 DIAGNOSIS — Z6841 Body Mass Index (BMI) 40.0 and over, adult: Secondary | ICD-10-CM

## 2016-12-18 DIAGNOSIS — Z794 Long term (current) use of insulin: Secondary | ICD-10-CM

## 2016-12-18 DIAGNOSIS — I10 Essential (primary) hypertension: Secondary | ICD-10-CM

## 2016-12-18 DIAGNOSIS — E785 Hyperlipidemia, unspecified: Secondary | ICD-10-CM

## 2016-12-18 DIAGNOSIS — E1165 Type 2 diabetes mellitus with hyperglycemia: Secondary | ICD-10-CM

## 2016-12-18 DIAGNOSIS — E559 Vitamin D deficiency, unspecified: Secondary | ICD-10-CM

## 2016-12-19 ENCOUNTER — Other Ambulatory Visit: Payer: Self-pay

## 2016-12-19 DIAGNOSIS — I10 Essential (primary) hypertension: Secondary | ICD-10-CM

## 2016-12-19 DIAGNOSIS — Z794 Long term (current) use of insulin: Secondary | ICD-10-CM

## 2016-12-19 DIAGNOSIS — E1165 Type 2 diabetes mellitus with hyperglycemia: Secondary | ICD-10-CM

## 2016-12-19 LAB — BASIC METABOLIC PANEL
BUN: 15 mg/dL (ref 6–23)
CALCIUM: 9.9 mg/dL (ref 8.4–10.5)
CO2: 29 meq/L (ref 19–32)
CREATININE: 0.94 mg/dL (ref 0.40–1.20)
Chloride: 98 mEq/L (ref 96–112)
GFR: 64.05 mL/min (ref >60.00)
GLUCOSE: 345 mg/dL — AB (ref 70–99)
Potassium: 4.3 mEq/L (ref 3.5–5.1)
Sodium: 135 mEq/L (ref 135–145)

## 2016-12-19 LAB — HEMOGLOBIN A1C: Hgb A1c MFr Bld: 12.4 % — ABNORMAL HIGH (ref 4.6–6.5)

## 2016-12-21 ENCOUNTER — Encounter: Payer: Self-pay | Admitting: Family Medicine

## 2016-12-21 MED ORDER — ATORVASTATIN CALCIUM 40 MG PO TABS: 40.0000 mg | ORAL_TABLET | Freq: Every day | ORAL | 2 refills | Status: DC

## 2016-12-21 NOTE — Patient Instructions (Signed)
EPIC  down. 

## 2016-12-23 ENCOUNTER — Other Ambulatory Visit: Payer: Self-pay | Admitting: Family Medicine

## 2016-12-24 ENCOUNTER — Other Ambulatory Visit: Payer: Self-pay

## 2016-12-24 MED ORDER — LIRAGLUTIDE 18 MG/3ML ~~LOC~~ SOPN: PEN_INJECTOR | SUBCUTANEOUS | 2 refills | Status: DC

## 2016-12-24 MED ORDER — INSULIN GLARGINE 100 UNIT/ML SOLOSTAR PEN: PEN_INJECTOR | SUBCUTANEOUS | 1 refills | Status: DC

## 2017-02-11 ENCOUNTER — Other Ambulatory Visit: Payer: Self-pay | Admitting: Family Medicine

## 2017-02-11 DIAGNOSIS — F329 Major depressive disorder, single episode, unspecified: Secondary | ICD-10-CM

## 2017-02-11 DIAGNOSIS — F419 Anxiety disorder, unspecified: Principal | ICD-10-CM

## 2017-02-11 DIAGNOSIS — F32A Depression, unspecified: Secondary | ICD-10-CM

## 2017-02-13 ENCOUNTER — Other Ambulatory Visit: Payer: Self-pay | Admitting: Family Medicine

## 2017-02-13 DIAGNOSIS — I10 Essential (primary) hypertension: Secondary | ICD-10-CM

## 2017-03-21 ENCOUNTER — Other Ambulatory Visit: Payer: Self-pay | Admitting: Family Medicine

## 2017-04-24 NOTE — Progress Notes (Signed)
HPI:   Julie Black is a 61 y.o. female, who is here today to follow on some chronic medical problems.  She was last seen on 12/18/16.  HTN: She is on Lisinopril-HCTZ 20-12.5 mg daily. Denies severe/frequent headache, visual changes, chest pain, dyspnea, palpitation, claudication, focal weakness, or edema.  She is not checking BP at home.  Diabetes Mellitus II:   Currently on Lantus 45 U daily, Victoza 1.6 mg daily, and Metformin 1000 mg bid.  FG: 160's Post prandial glucose: mid 200's Hypoglycemia: Denies. Last eye exam 07/2016.  She is tolerating medications well. She denies abdominal pain, nausea, vomiting, polydipsia, polyuria, or polyphagia. Stable tingling sensation left foot (4-5th toe). Denies burning.  She decreased amount of carbs intake. In regard to exercise she walks "a lot", she walks her dogs daily.    Lab Results  Component Value Date   CREATININE 0.94 12/19/2016   BUN 15 12/19/2016   NA 135 12/19/2016   K 4.3 12/19/2016   CL 98 12/19/2016   CO2 29 12/19/2016    Lab Results  Component Value Date   HGBA1C 12.4 (H) 12/19/2016   Lab Results  Component Value Date   MICROALBUR 4.0 (H) 01/01/2016   + Smoker.  Vit D deficiency:  She is on OTC Vit D3 5000 U daily.  HLD: She is not on statin medication. She was on Atorvastatin 40 mg, which discontinued about 2 years ago. She did not have side effects. She is following low fat diet consistently.  Lab Results  Component Value Date   CHOL 166 01/01/2016   HDL 42.70 01/01/2016   LDLCALC 93 01/01/2016   TRIG 151.0 (H) 01/01/2016   CHOLHDL 4 01/01/2016   Hypothyroidism:  Currently she is on Levothyroxine 112 mcg daily. Tolerating medication well, no side effects reported. She has not noted dysphagia,changes in bowel habits, tremor, cold/heat intolerance, or abnormal weight loss.  Lab Results  Component Value Date   TSH 3.76 01/01/2016    Today she is c/o worsening  tinnitus left ear, stable hearing loss in same ear. She has had tinnitus for about 10 years, started after suppurative left OM with TM rupture. She denies dizziness or nausea.   Review of Systems  Constitutional: Negative for activity change, appetite change, fatigue, fever and unexpected weight change.  HENT: Positive for hearing loss and tinnitus. Negative for ear discharge, ear pain, mouth sores, nosebleeds, sinus pain, sore throat and trouble swallowing.   Eyes: Negative for redness and visual disturbance.  Respiratory: Negative for cough, shortness of breath and wheezing.   Cardiovascular: Negative for chest pain, palpitations and leg swelling.  Gastrointestinal: Negative for abdominal pain, nausea and vomiting.       Negative for changes in bowel habits.  Endocrine: Negative for cold intolerance, heat intolerance, polydipsia, polyphagia and polyuria.  Genitourinary: Negative for decreased urine volume, dysuria and hematuria.  Musculoskeletal: Negative for gait problem and myalgias.  Skin: Negative for rash and wound.  Neurological: Negative for syncope, weakness and headaches.  Psychiatric/Behavioral: Negative for confusion.      Current Outpatient Medications on File Prior to Visit  Medication Sig Dispense Refill  . amitriptyline (ELAVIL) 10 MG tablet TAKE THREE TABLETS BY MOUTH AT BEDTIME 270 tablet 1  . B Complex-Biotin-FA (B-COMPLEX PO) Take by mouth.    Marland Kitchen. BIOTIN PO Take by mouth.    . citalopram (CELEXA) 20 MG tablet TAKE ONE & ONE-HALF TABLETS BY MOUTH ONCE DAILY 135 tablet 1  .  CLOBETASOL PROPIONATE E 0.05 % emollient cream APPLY CREAM TOPICALLY TO AFFECTED AREA AS DIRECTED 45 g 1  . glimepiride (AMARYL) 4 MG tablet Take 1 tablet (4 mg total) by mouth 2 (two) times daily. 90 tablet 3  . Insulin Glargine (LANTUS SOLOSTAR) 100 UNIT/ML Solostar Pen INJECT 55 UNITS SUBCUTANEOUSLY ONCE DAILY AT  THE  SAME  TIME  EACH  DAY. INCREASE TO 60 UNITS IN A WEEK IF BLOOD SUGARS STILL >  200. 45 mL 1  . Insulin Pen Needle 32G X 4 MM MISC Use with lantus pen. 100 each 2  . levothyroxine (SYNTHROID, LEVOTHROID) 112 MCG tablet TAKE 1 TABLET BY MOUTH ONCE DAILY BEFORE BREAKFAST 90 tablet 1  . liraglutide (VICTOZA) 18 MG/3ML SOPN Inject 0.6 mg subcutaneously daily, increase to 1.2 mg in a week. 9 mL 2  . lisinopril-hydrochlorothiazide (PRINZIDE,ZESTORETIC) 20-12.5 MG tablet TAKE 1 TABLET BY MOUTH ONCE DAILY 90 tablet 1  . metFORMIN (GLUCOPHAGE) 1000 MG tablet TAKE ONE TABLET BY MOUTH TWICE DAILY WITH A MEAL 180 tablet 2  . pantoprazole (PROTONIX) 40 MG tablet TAKE ONE TABLET BY MOUTH ONCE DAILY 90 tablet 3   No current facility-administered medications on file prior to visit.      Past Medical History:  Diagnosis Date  . Anxiety   . Depression   . Diabetes mellitus without complication (HCC)   . Hyperlipidemia   . Hypertension   . Migraine headache with aura   . Thyroid disease    No Known Allergies  Social History   Socioeconomic History  . Marital status: Widowed    Spouse name: None  . Number of children: None  . Years of education: None  . Highest education level: None  Social Needs  . Financial resource strain: None  . Food insecurity - worry: None  . Food insecurity - inability: None  . Transportation needs - medical: None  . Transportation needs - non-medical: None  Occupational History  . None  Tobacco Use  . Smoking status: Current Every Day Smoker    Packs/day: 0.25    Years: 3.00    Pack years: 0.75    Types: Cigarettes  . Smokeless tobacco: Never Used  Substance and Sexual Activity  . Alcohol use: Yes    Comment: occasional  . Drug use: No  . Sexual activity: None  Other Topics Concern  . None  Social History Narrative  . None    Vitals:   04/25/17 0843  Pulse: 100  Resp: 16  Temp: 98.1 F (36.7 C)  SpO2: 95%   Body mass index is 46.5 kg/m.  Wt Readings from Last 3 Encounters:  04/25/17 275 lb 2 oz (124.8 kg)  12/21/16 283  lb 5 oz (128.5 kg)  05/17/16 293 lb 4 oz (133 kg)     Physical Exam  Nursing note and vitals reviewed. Constitutional: She is oriented to person, place, and time. She appears well-developed. No distress.  HENT:  Head: Normocephalic and atraumatic.  Right Ear: Tympanic membrane, external ear and ear canal normal.  Mouth/Throat: Oropharynx is clear and moist and mucous membranes are normal.  Left ear canal excess cerumen,not able to see TM.  Eyes: Conjunctivae are normal. Pupils are equal, round, and reactive to light.  Neck: No tracheal deviation present. No thyroid mass and no thyromegaly present.  Cardiovascular: Normal rate and regular rhythm.  No murmur heard. Pulses:      Dorsalis pedis pulses are 2+ on the right side, and 2+ on  the left side.  Respiratory: Effort normal and breath sounds normal. No respiratory distress.  GI: Soft. She exhibits no mass. There is no hepatomegaly. There is no tenderness.  Musculoskeletal: She exhibits no edema.  Lymphadenopathy:    She has no cervical adenopathy.  Neurological: She is alert and oriented to person, place, and time. She has normal strength. Gait normal.  Skin: Skin is warm. No rash noted. No erythema.  Psychiatric: She has a normal mood and affect.  Well groomed, good eye contact.   Foot exam 12/2016.   ASSESSMENT AND PLAN:   Ms. Lakshmi was seen today for follow-up.  Diagnoses and all orders for this visit:  Lab Results  Component Value Date   HGBA1C 9.1 (H) 04/25/2017   Lab Results  Component Value Date   CREATININE 0.78 04/25/2017   BUN 11 04/25/2017   NA 136 04/25/2017   K 3.9 04/25/2017   CL 99 04/25/2017   CO2 30 04/25/2017   Lab Results  Component Value Date   TSH 6.36 (H) 04/25/2017    Insulin dependent type 2 diabetes mellitus, uncontrolled (HCC)  HgA1C not at goal. No changes in current management, will make adjustments based on HgA1C. Regular exercise and healthy diet with avoidance of added sugar  food intake is an important part of treatment and recommended. Annual eye exam, periodic dental and foot care recommended. F/U in 4-5 months  -     Basic metabolic panel -     Hemoglobin A1c -     Fructosamine  Vitamin D deficiency  No changes in current management, will follow labs done today and will give further recommendations accordingly. F/U in 4 months.  -     VITAMIN D 25 Hydroxy (Vit-D Deficiency, Fractures)  Hyperlipidemia, unspecified hyperlipidemia type  Educated about side effects and benefits of statin meds. She agrees with resuming Atorvastatin, recommend 20 mg daily. Continue low fat diet. F/U in 4-5 months.  -     atorvastatin (LIPITOR) 20 MG tablet; Take 1 tablet (20 mg total) by mouth daily.  Essential hypertension  Adequately controlled. No changes in current management. DASH diet recommended. Eye exam recommended annually. F/U in 4-5 months, before if needed.  -     Basic metabolic panel  Postsurgical hypothyroidism  No changes in current management, will follow labs done today and will give further recommendations accordingly. F/U in 6-12 months.  -     TSH  Class 3 drug-induced obesity with serious comorbidity and body mass index (BMI) of 45.0 to 49.9 in adult [facility])  About 8 Lb loss since her last OV, 12/2016. We discussed benefits of wt loss as well as adverse effects of obesity. Consistency with healthy diet and physical activity recommended..  Tinnitus of left ear  Worsening + hearing loss. ENT evaluation will be arranged. Instructed about warning signs.  -     Ambulatory referral to ENT  Hearing loss of left ear, unspecified hearing loss type  ? Conductive. ENT referral placed.    -Ms. Darrold Junker was advised to return sooner than planned today if new concerns arise.       Betty G. [place], MD  [facility]. [place] office.

## 2017-04-25 ENCOUNTER — Ambulatory Visit: Payer: BLUE CROSS/BLUE SHIELD | Admitting: Family Medicine

## 2017-04-25 ENCOUNTER — Encounter: Payer: Self-pay | Admitting: Family Medicine

## 2017-04-25 VITALS — HR 100 | Temp 98.1°F | Resp 16 | Ht 64.5 in | Wt 275.1 lb

## 2017-04-25 DIAGNOSIS — Z23 Encounter for immunization: Secondary | ICD-10-CM

## 2017-04-25 DIAGNOSIS — H9312 Tinnitus, left ear: Secondary | ICD-10-CM | POA: Diagnosis not present

## 2017-04-25 DIAGNOSIS — E1165 Type 2 diabetes mellitus with hyperglycemia: Secondary | ICD-10-CM

## 2017-04-25 DIAGNOSIS — E661 Drug-induced obesity: Secondary | ICD-10-CM

## 2017-04-25 DIAGNOSIS — Z6841 Body Mass Index (BMI) 40.0 and over, adult: Secondary | ICD-10-CM

## 2017-04-25 DIAGNOSIS — IMO0002 Reserved for concepts with insufficient information to code with codable children: Secondary | ICD-10-CM

## 2017-04-25 DIAGNOSIS — Z794 Long term (current) use of insulin: Secondary | ICD-10-CM | POA: Diagnosis not present

## 2017-04-25 DIAGNOSIS — E890 Postprocedural hypothyroidism: Secondary | ICD-10-CM

## 2017-04-25 DIAGNOSIS — H9192 Unspecified hearing loss, left ear: Secondary | ICD-10-CM | POA: Diagnosis not present

## 2017-04-25 DIAGNOSIS — E785 Hyperlipidemia, unspecified: Secondary | ICD-10-CM | POA: Diagnosis not present

## 2017-04-25 DIAGNOSIS — I10 Essential (primary) hypertension: Secondary | ICD-10-CM | POA: Diagnosis not present

## 2017-04-25 DIAGNOSIS — E559 Vitamin D deficiency, unspecified: Secondary | ICD-10-CM

## 2017-04-25 LAB — BASIC METABOLIC PANEL
BUN: 11 mg/dL (ref 6–23)
CALCIUM: 9.1 mg/dL (ref 8.4–10.5)
CO2: 30 meq/L (ref 19–32)
Chloride: 99 mEq/L (ref 96–112)
Creatinine, Ser: 0.78 mg/dL (ref 0.40–1.20)
GFR: 79.35 mL/min (ref >60.00)
GLUCOSE: 206 mg/dL — AB (ref 70–99)
POTASSIUM: 3.9 meq/L (ref 3.5–5.1)
SODIUM: 136 meq/L (ref 135–145)

## 2017-04-25 LAB — TSH: TSH: 6.36 u[IU]/mL — ABNORMAL HIGH (ref 0.35–4.50)

## 2017-04-25 LAB — VITAMIN D 25 HYDROXY (VIT D DEFICIENCY, FRACTURES): VITD: 24.22 ng/mL — AB (ref 30.00–100.00)

## 2017-04-25 LAB — HEMOGLOBIN A1C: Hgb A1c MFr Bld: 9.1 % — ABNORMAL HIGH (ref 4.6–6.5)

## 2017-04-25 MED ORDER — ATORVASTATIN CALCIUM 20 MG PO TABS: 20.0000 mg | ORAL_TABLET | Freq: Every day | ORAL | 2 refills | Status: DC

## 2017-04-25 NOTE — Patient Instructions (Addendum)
A few things to remember from today's visit:   Insulin dependent type 2 diabetes mellitus, uncontrolled (HCC) - Plan: Basic metabolic panel, Hemoglobin A1c, Fructosamine  Vitamin D deficiency - Plan: VITAMIN D 25 Hydroxy (Vit-D Deficiency, Fractures)  Hyperlipidemia, unspecified hyperlipidemia type - Plan: atorvastatin (LIPITOR) 20 MG tablet  Essential hypertension - Plan: Basic metabolic panel  Postsurgical hypothyroidism - Plan: TSH   Carbohydrate Counting for Diabetes Mellitus, Adult Carbohydrate counting is a method for keeping track of how many carbohydrates you eat. Eating carbohydrates naturally increases the amount of sugar (glucose) in the blood. Counting how many carbohydrates you eat helps keep your blood glucose within normal limits, which helps you manage your diabetes (diabetes mellitus). It is important to know how many carbohydrates you can safely have in each meal. This is different for every person. A diet and nutrition specialist (registered dietitian) can help you make a meal plan and calculate how many carbohydrates you should have at each meal and snack. Carbohydrates are found in the following foods:  Grains, such as breads and cereals.  Dried beans and soy products.  Starchy vegetables, such as potatoes, peas, and corn.  Fruit and fruit juices.  Milk and yogurt.  Sweets and snack foods, such as cake, cookies, candy, chips, and soft drinks.  How do I count carbohydrates? There are two ways to count carbohydrates in food. You can use either of the methods or a combination of both. Reading "Nutrition Facts" on packaged food The "Nutrition Facts" list is included on the labels of almost all packaged foods and beverages in the [place] It includes:  The serving size.  Information about nutrients in each serving, including the grams (g) of carbohydrate per serving.  To use the "Nutrition Facts":  Decide how many servings you will have.  Multiply the number of  servings by the number of carbohydrates per serving.  The resulting number is the total amount of carbohydrates that you will be having.  Learning standard serving sizes of other foods When you eat foods containing carbohydrates that are not packaged or do not include "Nutrition Facts" on the label, you need to measure the servings in order to count the amount of carbohydrates:  Measure the foods that you will eat with a food scale or measuring cup, if needed.  Decide how many standard-size servings you will eat.  Multiply the number of servings by 15. Most carbohydrate-rich foods have about 15 g of carbohydrates per serving. ? For example, if you eat 8 oz (170 g) of strawberries, you will have eaten 2 servings and 30 g of carbohydrates (2 servings x 15 g = 30 g).  For foods that have more than one food mixed, such as soups and casseroles, you must count the carbohydrates in each food that is included.  The following list contains standard serving sizes of common carbohydrate-rich foods. Each of these servings has about 15 g of carbohydrates:   hamburger bun or  English muffin.   oz (15 mL) syrup.   oz (14 g) jelly.  1 slice of bread.  1 six-inch tortilla.  3 oz (85 g) cooked rice or pasta.  4 oz (113 g) cooked dried beans.  4 oz (113 g) starchy vegetable, such as peas, corn, or potatoes.  4 oz (113 g) hot cereal.  4 oz (113 g) mashed potatoes or  of a large baked potato.  4 oz (113 g) canned or frozen fruit.  4 oz (120 mL) fruit juice.  4-6  crackers.  6 chicken nuggets.  6 oz (170 g) unsweetened dry cereal.  6 oz (170 g) plain fat-free yogurt or yogurt sweetened with artificial sweeteners.  8 oz (240 mL) milk.  8 oz (170 g) fresh fruit or one small piece of fruit.  24 oz (680 g) popped popcorn.  Example of carbohydrate counting Sample meal  3 oz (85 g) chicken breast.  6 oz (170 g) brown rice.  4 oz (113 g) corn.  8 oz (240 mL) milk.  8 oz (170  g) strawberries with sugar-free whipped topping. Carbohydrate calculation 1. Identify the foods that contain carbohydrates: ? Rice. ? Corn. ? Milk. ? Strawberries. 2. Calculate how many servings you have of each food: ? 2 servings rice. ? 1 serving corn. ? 1 serving milk. ? 1 serving strawberries. 3. Multiply each number of servings by 15 g: ? 2 servings rice x 15 g = 30 g. ? 1 serving corn x 15 g = 15 g. ? 1 serving milk x 15 g = 15 g. ? 1 serving strawberries x 15 g = 15 g. 4. Add together all of the amounts to find the total grams of carbohydrates eaten: ? 30 g + 15 g + 15 g + 15 g = 75 g of carbohydrates total. This information is not intended to replace advice given to you by your health care provider. Make sure you discuss any questions you have with your health care provider. Document Released: 04/29/2005 Document Revised: 11/17/2015 Document Reviewed: 10/11/2015 Elsevier Interactive Patient Education  2018 [employer]  Please be sure medication list is accurate. If a new problem present, please set up appointment sooner than planned today.

## 2017-04-27 LAB — FRUCTOSAMINE: FRUCTOSAMINE: 312 umol/L — AB (ref 190–270)

## 2017-04-28 ENCOUNTER — Other Ambulatory Visit: Payer: Self-pay | Admitting: Family Medicine

## 2017-06-27 ENCOUNTER — Other Ambulatory Visit: Payer: Self-pay | Admitting: Family Medicine

## 2017-08-17 ENCOUNTER — Other Ambulatory Visit: Payer: Self-pay | Admitting: Family Medicine

## 2017-08-24 NOTE — Progress Notes (Addendum)
HPI:   Julie Black is a 62 y.o. female, who is here today for 4 months follow up.   She was last seen on 04/25/17.  Diabetes Mellitus II:    Currently on Victoza 1.2 mg daily, Lantus 50 U daily, and Metformin 1000 mg bid.  Checking BS's : "up and down" max 300's Hypoglycemia: None  She is tolerating medications well.  Last eye exam: Has appt this month.  She denies abdominal pain, nausea, vomiting, polydipsia, polyuria, or polyphagia. Tingling on left foot (4-5th toes), stable..    Lab Results  Component Value Date   HGBA1C 9.1 (H) 04/25/2017   Lab Results  Component Value Date   MICROALBUR 4.0 (H) 01/01/2016    Hypertension:   Currently on Lisinopril-HCTZ 20-12.5 mg   No side effects reported.  She has not noted unusual headache, visual changes, exertional chest pain, dyspnea,  focal weakness, or edema.   Lab Results  Component Value Date   CREATININE 0.78 04/25/2017   BUN 11 04/25/2017   NA 136 04/25/2017   K 3.9 04/25/2017   CL 99 04/25/2017   CO2 30 04/25/2017    Vit D deficiency:  Currently on Vit D 10,000 U daily. Last 25 OH vit D in 04/2017 still low at 24.2 (17.0).   Hyperlipidemia:  Currently on Atorvastatin 20 mg daily. Following a low fat diet: Not consistently.  She has not noted side effects with medication.  Lab Results  Component Value Date   CHOL 166 01/01/2016   HDL 42.70 01/01/2016   LDLCALC 93 01/01/2016   TRIG 151.0 (H) 01/01/2016   CHOLHDL 4 01/01/2016   Hypothyroidism:  Currently on Levothyroxine 112 mcg daily. Tolerating medication well, no side effects reported. Negative for changes in bowel habits, tremor, cold/heat intolerance, or abnormal weight loss.  Lab Results  Component Value Date   TSH 6.36 (H) 04/25/2017   She is concerned about her sister recent Dx of thyroid cancer, Bx, pending thyroidectomy in 10/2017. She is not sure about type cancer.  Anxiety and depression:  Currently  she is on Celexa 30 mg daily (20 mg 1.5 tab). She is doing "fine" with current dose.  Takes Amitriptyline for migraines.   She has been able to fit in clothes she could not wear before.  She is exercising more, 3 times per week. She has not been consistent with a healthy diet.    Review of Systems  Constitutional: Positive for fatigue (no more than usual). Negative for activity change, appetite change and fever.  HENT: Negative for mouth sores, nosebleeds and trouble swallowing.   Eyes: Negative for redness and visual disturbance.  Respiratory: Negative for cough, shortness of breath and wheezing.   Cardiovascular: Negative for chest pain, palpitations and leg swelling.  Gastrointestinal: Negative for abdominal pain, nausea and vomiting.       Negative for changes in bowel habits.  Endocrine: Negative for cold intolerance, heat intolerance, polydipsia, polyphagia and polyuria.  Genitourinary: Negative for decreased urine volume, dysuria and hematuria.  Musculoskeletal: Negative for gait problem and myalgias.  Skin: Negative for rash and wound.  Neurological: Negative for syncope, weakness and headaches.  Psychiatric/Behavioral: Negative for confusion.    Current Outpatient Medications on File Prior to Visit  Medication Sig Dispense Refill  . amitriptyline (ELAVIL) 10 MG tablet TAKE THREE TABLETS BY MOUTH AT BEDTIME 270 tablet 1  . atorvastatin (LIPITOR) 20 MG tablet Take 1 tablet (20 mg total) by mouth daily. 90 tablet  2  . B Complex-Biotin-FA (B-COMPLEX PO) Take by mouth.    Marland Kitchen BIOTIN PO Take by mouth.    . citalopram (CELEXA) 20 MG tablet TAKE ONE & ONE-HALF TABLETS BY MOUTH ONCE DAILY 135 tablet 1  . CLOBETASOL PROPIONATE E 0.05 % emollient cream APPLY CREAM TOPICALLY TO AFFECTED AREA AS DIRECTED 45 g 1  . Insulin Glargine (LANTUS SOLOSTAR) 100 UNIT/ML Solostar Pen INJECT 55 UNITS SUBCUTANEOUSLY ONCE DAILY AT  THE  SAME  TIME  EACH  DAY. INCREASE TO 60 UNITS IN A WEEK IF BLOOD  SUGARS STILL > 200. 45 mL 1  . LANTUS SOLOSTAR 100 UNIT/ML Solostar Pen INJECT 50 UNITS SUBCUTANEOUSLY ONCE DAILY AT  THE  SAME  TIME  EACH  DAY 45 mL 1  . levothyroxine (SYNTHROID, LEVOTHROID) 112 MCG tablet TAKE 1 TABLET BY MOUTH ONCE DAILY BEFORE BREAKFAST 90 tablet 1  . liraglutide (VICTOZA) 18 MG/3ML SOPN INJECT 0.6 MG SUBCUTANEOUSLY DAILY, INCREASE TO 1.2 MG IN A WEEK 9 mL 2  . lisinopril-hydrochlorothiazide (PRINZIDE,ZESTORETIC) 20-12.5 MG tablet TAKE 1 TABLET BY MOUTH ONCE DAILY 90 tablet 1  . metFORMIN (GLUCOPHAGE) 1000 MG tablet TAKE ONE TABLET BY MOUTH TWICE DAILY WITH A MEAL 180 tablet 2  . pantoprazole (PROTONIX) 40 MG tablet TAKE ONE TABLET BY MOUTH ONCE DAILY 90 tablet 3  . RELION PEN NEEDLES 32G X 4 MM MISC USE WITH LANTUS PEN 100 each 2  . Vitamin D, Ergocalciferol, (DRISDOL) 50000 units CAPS capsule Take 50,000 Units by mouth daily.     No current facility-administered medications on file prior to visit.      Past Medical History:  Diagnosis Date  . Anxiety   . Depression   . Diabetes mellitus without complication (HCC)   . Hyperlipidemia   . Hypertension   . Migraine headache with aura   . Thyroid disease    No Known Allergies  Social History   Socioeconomic History  . Marital status: Widowed    Spouse name: Not on file  . Number of children: Not on file  . Years of education: Not on file  . Highest education level: Not on file  Occupational History  . Not on file  Social Needs  . Financial resource strain: Not on file  . Food insecurity:    Worry: Not on file    Inability: Not on file  . Transportation needs:    Medical: Not on file    Non-medical: Not on file  Tobacco Use  . Smoking status: Current Every Day Smoker    Packs/day: 0.25    Years: 3.00    Pack years: 0.75    Types: Cigarettes  . Smokeless tobacco: Never Used  Substance and Sexual Activity  . Alcohol use: Yes    Comment: occasional  . Drug use: No  . Sexual activity: Not on file    Lifestyle  . Physical activity:    Days per week: Not on file    Minutes per session: Not on file  . Stress: Not on file  Relationships  . Social connections:    Talks on phone: Not on file    Gets together: Not on file    Attends religious service: Not on file    Active member of club or organization: Not on file    Attends meetings of clubs or organizations: Not on file    Relationship status: Not on file  Other Topics Concern  . Not on file  Social History Narrative  .  Not on file    Vitals:   08/25/17 0832  BP: 130/80  Pulse: 100  Resp: 12  Temp: 98.3 F (36.8 C)  SpO2: 97%   Body mass index is 47.02 kg/m.    Physical Exam  Nursing note and vitals reviewed. Constitutional: She is oriented to person, place, and time. She appears well-developed. No distress.  HENT:  Head: Normocephalic and atraumatic.  Mouth/Throat: Oropharynx is clear and moist and mucous membranes are normal.  Eyes: Pupils are equal, round, and reactive to light. Conjunctivae and EOM are normal.  Neck: No tracheal deviation present. No thyroid mass and no thyromegaly present.  Cardiovascular: Normal rate and regular rhythm.  No murmur heard. Pulses:      Dorsalis pedis pulses are 2+ on the right side, and 2+ on the left side.  Respiratory: Effort normal and breath sounds normal. No respiratory distress.  GI: Soft. She exhibits no mass. There is no hepatomegaly. There is no tenderness.  Musculoskeletal: She exhibits no edema.  Lymphadenopathy:    She has no cervical adenopathy.  Neurological: She is alert and oriented to person, place, and time. She has normal strength. Coordination normal.  Skin: Skin is warm. No erythema.  Psychiatric: She has a normal mood and affect.  Well groomed, good eye contact.   Foot exam 12/2016.     ASSESSMENT AND PLAN:   Ms. Julie Black was seen today for 6 months follow-up.  Orders Placed This Encounter  Procedures  . US THYROID  .  Comprehensive metabolic panel  . Lipid panel  . Hemoglobin A1c  . TSH  . VITAMIN D 25 Hydroxy (Vit-D Deficiency, Fractures)    Essential hypertension Adequately controlled. No changes in current management. Low-salt diet recommended. Eye exam recommended annually. F/U in 6 months, before if needed.   Insulin dependent type 2 diabetes mellitus, uncontrolled (HCC) HgA1C has not been at goal. No changes in current management for now, given BS's she is reporting we will need to adjust management. Regular exercise and healthy diet with avoidance of added sugar food intake is an important part of treatment and recommended. Annual eye exam, periodic dental and foot care recommended. F/U in 4 months   Anxiety and depression Well controlled and stable. No changes in current management. Follow-up in 6-12 months.  Hyperlipidemia She is not fasting today, she will be back tomorrow for fasting labs. For now she will continue atorvastatin 20 mg daily, further recommendation will be given according to lipid results.  Postsurgical hypothyroidism S/P partial thyroidectomy. No changes in current management.  In regard to her sister's recent diagnosis of thyroid cancer, we discussed a few options at this time: Thyroid US vs endocrinology referral.  She prefers the former one, so thyroid US will be arranged. We will follow labs and recommendation will be given accordingly. Follow-up in 6-12 months.  Vitamin D deficiency No changes in vitamin D dose, further recommendations will be given according to vitamin D results.     -Julie Black to return sooner than planned today if new concerns arise.       Betty G. [place], MD  [facility]. [place] office.

## 2017-08-25 ENCOUNTER — Encounter: Payer: Self-pay | Admitting: Family Medicine

## 2017-08-25 ENCOUNTER — Ambulatory Visit: Payer: BLUE CROSS/BLUE SHIELD | Admitting: Family Medicine

## 2017-08-25 VITALS — BP 130/80 | HR 100 | Temp 98.3°F | Resp 12 | Ht 64.5 in | Wt 278.2 lb

## 2017-08-25 DIAGNOSIS — E1165 Type 2 diabetes mellitus with hyperglycemia: Secondary | ICD-10-CM

## 2017-08-25 DIAGNOSIS — E890 Postprocedural hypothyroidism: Secondary | ICD-10-CM | POA: Diagnosis not present

## 2017-08-25 DIAGNOSIS — F419 Anxiety disorder, unspecified: Secondary | ICD-10-CM | POA: Diagnosis not present

## 2017-08-25 DIAGNOSIS — E559 Vitamin D deficiency, unspecified: Secondary | ICD-10-CM | POA: Diagnosis not present

## 2017-08-25 DIAGNOSIS — F329 Major depressive disorder, single episode, unspecified: Secondary | ICD-10-CM

## 2017-08-25 DIAGNOSIS — E785 Hyperlipidemia, unspecified: Secondary | ICD-10-CM | POA: Diagnosis not present

## 2017-08-25 DIAGNOSIS — F32A Depression, unspecified: Secondary | ICD-10-CM

## 2017-08-25 DIAGNOSIS — Z794 Long term (current) use of insulin: Secondary | ICD-10-CM | POA: Diagnosis not present

## 2017-08-25 DIAGNOSIS — I10 Essential (primary) hypertension: Secondary | ICD-10-CM | POA: Diagnosis not present

## 2017-08-25 DIAGNOSIS — IMO0002 Reserved for concepts with insufficient information to code with codable children: Secondary | ICD-10-CM

## 2017-08-25 NOTE — Assessment & Plan Note (Signed)
No changes in vitamin D dose, further recommendations will be given according to vitamin D results.

## 2017-08-25 NOTE — Assessment & Plan Note (Addendum)
She is not fasting today, she will be back tomorrow for fasting labs. For now she will continue atorvastatin 20 mg daily, further recommendation will be given according to lipid results.

## 2017-08-25 NOTE — Assessment & Plan Note (Signed)
Adequately controlled. No changes in current management. Low salt diet recommended. Eye exam recommended annually. F/U in 6 months, before if needed.  

## 2017-08-25 NOTE — Assessment & Plan Note (Addendum)
S/P partial thyroidectomy. No changes in current management.  In regard to her sister's recent diagnosis of thyroid cancer, we discussed a few options at this time: Thyroid US vs endocrinology referral.  She prefers the former one, so thyroid US will be arranged. We will follow labs and recommendation will be given accordingly. Follow-up in 6-12 months.

## 2017-08-25 NOTE — Patient Instructions (Signed)
A few things to remember from today's visit:   Insulin dependent type 2 diabetes mellitus, uncontrolled (HCC) - Plan: Comprehensive metabolic panel, Hemoglobin A1c  Essential hypertension - Plan: Comprehensive metabolic panel  Hyperlipidemia, unspecified hyperlipidemia type - Plan: Comprehensive metabolic panel, Lipid panel  Vitamin D deficiency - Plan: VITAMIN D 25 Hydroxy (Vit-D Deficiency, Fractures)  Postsurgical hypothyroidism - Plan: TSH, US THYROID  No changes today. Will consider changing Lantus for Tojeou os Basaglar. We need to find out type of thyroid cancer your sister has.   Please be sure medication list is accurate. If a new problem present, please set up appointment sooner than planned today.

## 2017-08-25 NOTE — Assessment & Plan Note (Signed)
Well controlled and stable. No changes in current management. Follow-up in 6-12 months.

## 2017-08-25 NOTE — Assessment & Plan Note (Signed)
HgA1C has not been at goal. No changes in current management for now, given BS's she is reporting we will need to adjust management. Regular exercise and healthy diet with avoidance of added sugar food intake is an important part of treatment and recommended. Annual eye exam, periodic dental and foot care recommended. F/U in 4 months

## 2017-08-28 ENCOUNTER — Ambulatory Visit
Admission: RE | Admit: 2017-08-28 | Discharge: 2017-08-28 | Disposition: A | Discharge status: Home / Self Care | Payer: BLUE CROSS/BLUE SHIELD | Source: Ambulatory Visit | Attending: Family Medicine | Admitting: Family Medicine

## 2017-08-28 DIAGNOSIS — E890 Postprocedural hypothyroidism: Secondary | ICD-10-CM

## 2017-09-03 ENCOUNTER — Telehealth: Payer: Self-pay | Admitting: Family Medicine

## 2017-09-03 ENCOUNTER — Other Ambulatory Visit: Payer: Self-pay | Admitting: *Deleted

## 2017-09-03 DIAGNOSIS — E041 Nontoxic single thyroid nodule: Secondary | ICD-10-CM

## 2017-09-03 NOTE — Telephone Encounter (Signed)
Copied from CRM [id number]. Topic: Quick Communication - See Telephone Encounter >> Sep 03, 2017  2:40 PM [name], [name], [place] wrote: CRM for notification. See Telephone encounter for: 09/03/17. Patient calling back to Julie Black to inform her of the doctor she was wanting to do the biopsy. Her name is Julie Black at [facility]. Patient is also requesting a call back to discuss the size of the mass? CB#: [phone]   or  [phone]

## 2017-09-03 NOTE — Telephone Encounter (Signed)
Spoke with patient and placed referral for endocrinology for biopsy placed.

## 2017-09-26 NOTE — Telephone Encounter (Signed)
Pt is still waiting on the referral to dr carly kelley at [facility]

## 2017-09-30 NOTE — Telephone Encounter (Signed)
Nothing had been notated to send to Dr. Nicholaus Bloom. Referral has been updated and records routed to her office.

## 2017-10-07 NOTE — Telephone Encounter (Signed)
Julie Black with [facility] calling and states that the paperwork that was faxed for Dr Wenda Low Ermalinda Barrios, endocrinology, was sent to the [facility] office. Please advise.

## 2017-10-09 ENCOUNTER — Other Ambulatory Visit: Payer: Self-pay | Admitting: Family Medicine

## 2017-10-09 DIAGNOSIS — F329 Major depressive disorder, single episode, unspecified: Secondary | ICD-10-CM

## 2017-10-09 DIAGNOSIS — F419 Anxiety disorder, unspecified: Secondary | ICD-10-CM

## 2017-10-09 DIAGNOSIS — G43109 Migraine with aura, not intractable, without status migrainosus: Secondary | ICD-10-CM

## 2017-10-09 DIAGNOSIS — F32A Depression, unspecified: Secondary | ICD-10-CM

## 2017-10-16 ENCOUNTER — Ambulatory Visit: Payer: BLUE CROSS/BLUE SHIELD | Admitting: Endocrinology

## 2017-11-11 ENCOUNTER — Other Ambulatory Visit: Payer: Self-pay | Admitting: Family Medicine

## 2017-12-21 ENCOUNTER — Other Ambulatory Visit: Payer: Self-pay | Admitting: Family Medicine

## 2017-12-26 ENCOUNTER — Ambulatory Visit: Payer: BLUE CROSS/BLUE SHIELD | Admitting: Family Medicine

## 2018-01-12 NOTE — Progress Notes (Deleted)
HPI:   [name] is a 62 y.o. female, who is here today for *** months follow up.   *** was last seen on ***  Since *** last OV she has ***  Diabetes Mellitus II:    Currently on ***. Problem has been stable. Checking BS's : *** Hypoglycemia:  *** is tolerating medications well. *** denies abdominal pain, nausea, vomiting, polydipsia, polyuria, or polyphagia. ***numbness, tingling, or burning.     Lab Results  Component Value Date   CREATININE 0.78 04/25/2017   BUN 11 04/25/2017   NA 136 04/25/2017   K 3.9 04/25/2017   CL 99 04/25/2017   CO2 30 04/25/2017    Lab Results  Component Value Date   HGBA1C 9.1 (H) 04/25/2017   Lab Results  Component Value Date   MICROALBUR 4.0 (H) 01/01/2016     {4+ HPI elements (or status of 3 or more chronic diseases)} ***      Review of Systems  [review of 2 to 9 systems] ***  Current Outpatient Medications on File Prior to Visit  Medication Sig Dispense Refill  . amitriptyline (ELAVIL) 10 MG tablet TAKE 3 TABLETS BY MOUTH ONCE DAILY AT BEDTIME 270 tablet 1  . atorvastatin (LIPITOR) 20 MG tablet Take 1 tablet (20 mg total) by mouth daily. 90 tablet 2  . B Complex-Biotin-FA (B-COMPLEX PO) Take by mouth.    Marland Kitchen BIOTIN PO Take by mouth.    . citalopram (CELEXA) 20 MG tablet TAKE 1 & 1/2 (ONE & ONE-HALF) TABLETS BY MOUTH ONCE DAILY 135 tablet 1  . CLOBETASOL PROPIONATE E 0.05 % emollient cream APPLY  CREAM TOPICALLY TO AFFECTED AREA AS DIRECTED 45 g 1  . Insulin Glargine (LANTUS SOLOSTAR) 100 UNIT/ML Solostar Pen INJECT 55 UNITS SUBCUTANEOUSLY ONCE DAILY AT  THE  SAME  TIME  EACH  DAY. INCREASE TO 60 UNITS IN A WEEK IF BLOOD SUGARS STILL > 200. 45 mL 1  . LANTUS SOLOSTAR 100 UNIT/ML Solostar Pen INJECT 50 UNITS SUBCUTANEOUSLY ONCE DAILY AT  THE  SAME  TIME  EACH  DAY 45 mL 1  . levothyroxine (SYNTHROID, LEVOTHROID) 112 MCG tablet TAKE 1 TABLET BY MOUTH ONCE DAILY BEFORE BREAKFAST 90 tablet 1  .  liraglutide (VICTOZA) 18 MG/3ML SOPN INJECT 0.6 MG UNDER THE SKIN ONCE DAILY FOR 1 WEEK, AND THEN INCREASE TO 1.2 MG ONCE DAILY 6 pen 2  . lisinopril-hydrochlorothiazide (PRINZIDE,ZESTORETIC) 20-12.5 MG tablet TAKE 1 TABLET BY MOUTH ONCE DAILY 90 tablet 1  . metFORMIN (GLUCOPHAGE) 1000 MG tablet TAKE ONE TABLET BY MOUTH TWICE DAILY WITH A MEAL 180 tablet 2  . pantoprazole (PROTONIX) 40 MG tablet TAKE ONE TABLET BY MOUTH ONCE DAILY 90 tablet 3  . RELION PEN NEEDLES 32G X 4 MM MISC USE AS DIRECTED 100 each 2  . Vitamin D, Ergocalciferol, (DRISDOL) 50000 units CAPS capsule Take 50,000 Units by mouth daily.     No current facility-administered medications on file prior to visit.      Past Medical History:  Diagnosis Date  . Anxiety   . Depression   . Diabetes mellitus without complication (HCC)   . Hyperlipidemia   . Hypertension   . Migraine headache with aura   . Thyroid disease    No Known Allergies  Social History   Socioeconomic History  . Marital status: Widowed    Spouse name: Not on file  . Number of children: Not on file  . Years of education:  Not on file  . Highest education level: Not on file  Occupational History  . Not on file  Social Needs  . Financial resource strain: Not on file  . Food insecurity:    Worry: Not on file    Inability: Not on file  . Transportation needs:    Medical: Not on file    Non-medical: Not on file  Tobacco Use  . Smoking status: Current Every Day Smoker    Packs/day: 0.25    Years: 3.00    Pack years: 0.75    Types: Cigarettes  . Smokeless tobacco: Never Used  Substance and Sexual Activity  . Alcohol use: Yes    Comment: occasional  . Drug use: No  . Sexual activity: Not on file  Lifestyle  . Physical activity:    Days per week: Not on file    Minutes per session: Not on file  . Stress: Not on file  Relationships  . Social connections:    Talks on phone: Not on file    Gets together: Not on file    Attends religious  service: Not on file    Active member of club or organization: Not on file    Attends meetings of clubs or organizations: Not on file    Relationship status: Not on file  Other Topics Concern  . Not on file  Social History Narrative  . Not on file    There were no vitals filed for this visit. There is no height or weight on file to calculate BMI.      Physical Exam  {[12+ exam elements]} ***  ASSESSMENT AND PLAN:   Ms. Julie Black was seen today for *** months follow-up.  No orders of the defined types were placed in this encounter.   No problem-specific Assessment & Plan notes found for this encounter.           -Ms. Julie Black was advised to return sooner than planned today if new concerns arise.       Korryn Pancoast G. [place], MD  [facility]. [place] office.

## 2018-01-13 ENCOUNTER — Ambulatory Visit: Payer: BLUE CROSS/BLUE SHIELD | Admitting: Family Medicine

## 2018-01-13 DIAGNOSIS — Z0289 Encounter for other administrative examinations: Secondary | ICD-10-CM

## 2018-01-13 NOTE — Progress Notes (Signed)
HPI:   Julie Black is a 62 y.o. female, who is here today for follow up.   Last follow-up appointment in 01/2017.  Diabetes Mellitus II:  Currently on Lantus 55 units daily, Victoza 1.2 mg daily, metformin 1000 mg twice daily.  Checking BS's : "Not great" but < 200.  Hypoglycemia:Denies.  She is tolerating medications well. Negative for abdominal pain, nausea, vomiting, polydipsia, polyuria, or polyphagia. No numbness, tingling, or burning.  Lab Results  Component Value Date   CREATININE 0.78 04/25/2017   BUN 11 04/25/2017   NA 136 04/25/2017   K 3.9 04/25/2017   CL 99 04/25/2017   CO2 30 04/25/2017    Lab Results  Component Value Date   HGBA1C 9.1 (H) 04/25/2017   Lab Results  Component Value Date   MICROALBUR 4.0 (H) 01/01/2016   HLD: She is on atorvastatin 20 mg daily. She has not been consistent with low-fat diet. She is tolerating medication well. She is exercising about 3-4 times per week, she is going to the gym.  Lab Results  Component Value Date   CHOL 166 01/01/2016   HDL 42.70 01/01/2016   LDLCALC 93 01/01/2016   TRIG 151.0 (H) 01/01/2016   CHOLHDL 4 01/01/2016   Still smoking but trying to decrease the number of cigarettes per day.   Vitamin D deficiency: Currently she is on OTC vitamin D3 10,000 units daily, sometimes she takes higher dose, mainly when she feels fatigued.  04/2017 25 OH vit D low at 24.22.  Hypertension: Currently she is on lisinopril-HCTZ 20-12.5 mg daily. Denies severe/frequent headache, visual changes, chest pain, dyspnea, palpitation, claudication, focal weakness, or edema.   Hypothyroidism: Follow with endocrinologist, Dr. Tama High. Thyroid biopsy last week, result is still pending.  Today she is complaining about left-sided lower back pain radiated to left lower extremity, posterior aspect of thighs and knee, achy-like pain.  She has not noted numbness or tingling, denies saddle anesthesia or  urine/bowel incontinence. She has history of lower back pain, back in [place] she was following with orthopedist.   Review of Systems  Constitutional: Positive for fatigue. Negative for activity change, appetite change, fever and unexpected weight change.  HENT: Negative for mouth sores, nosebleeds and trouble swallowing.   Eyes: Negative for redness and visual disturbance.  Respiratory: Negative for cough, shortness of breath and wheezing.   Cardiovascular: Negative for chest pain, palpitations and leg swelling.  Gastrointestinal: Negative for abdominal pain, nausea and vomiting.       Negative for changes in bowel habits.  Endocrine: Negative for polydipsia, polyphagia and polyuria.  Genitourinary: Negative for decreased urine volume, difficulty urinating, dysuria and hematuria.  Musculoskeletal: Positive for arthralgias and back pain. Negative for gait problem.  Skin: Negative for rash and wound.  Neurological: Negative for syncope, weakness, numbness and headaches.    Current Outpatient Medications on File Prior to Visit  Medication Sig Dispense Refill  . amitriptyline (ELAVIL) 10 MG tablet TAKE 3 TABLETS BY MOUTH ONCE DAILY AT BEDTIME 270 tablet 1  . aspirin EC 81 MG tablet Take by mouth.    Marland Kitchen atorvastatin (LIPITOR) 20 MG tablet Take 1 tablet (20 mg total) by mouth daily. 90 tablet 2  . B Complex-Biotin-FA (B-COMPLEX PO) Take by mouth.    Marland Kitchen BIOTIN PO Take by mouth.    . CLOBETASOL PROPIONATE E 0.05 % emollient cream APPLY  CREAM TOPICALLY TO AFFECTED AREA AS DIRECTED 45 g 1  . Insulin Glargine (LANTUS  SOLOSTAR) 100 UNIT/ML Solostar Pen INJECT 55 UNITS SUBCUTANEOUSLY ONCE DAILY AT  THE  SAME  TIME  EACH  DAY. INCREASE TO 60 UNITS IN A WEEK IF BLOOD SUGARS STILL > 200. 45 mL 1  . levothyroxine (SYNTHROID, LEVOTHROID) 112 MCG tablet TAKE 1 TABLET BY MOUTH ONCE DAILY BEFORE BREAKFAST 90 tablet 1  . liraglutide (VICTOZA) 18 MG/3ML SOPN INJECT 0.6 MG UNDER THE SKIN ONCE DAILY FOR 1 WEEK,  AND THEN INCREASE TO 1.2 MG ONCE DAILY 6 pen 2  . lisinopril-hydrochlorothiazide (PRINZIDE,ZESTORETIC) 20-12.5 MG tablet TAKE 1 TABLET BY MOUTH ONCE DAILY 90 tablet 1  . metFORMIN (GLUCOPHAGE) 1000 MG tablet TAKE ONE TABLET BY MOUTH TWICE DAILY WITH A MEAL 180 tablet 2  . pantoprazole (PROTONIX) 40 MG tablet TAKE ONE TABLET BY MOUTH ONCE DAILY 90 tablet 3  . RELION PEN NEEDLES 32G X 4 MM MISC USE AS DIRECTED 100 each 2  . vitamin C (ASCORBIC ACID) 500 MG tablet Take by mouth.    . Vitamin D, Ergocalciferol, (DRISDOL) 50000 units CAPS capsule Take 50,000 Units by mouth daily.     No current facility-administered medications on file prior to visit.      Past Medical History:  Diagnosis Date  . Anxiety   . Depression   . Diabetes mellitus without complication (HCC)   . Hyperlipidemia   . Hypertension   . Migraine headache with aura   . Thyroid disease    No Known Allergies  Social History   Socioeconomic History  . Marital status: Widowed    Spouse name: Not on file  . Number of children: Not on file  . Years of education: Not on file  . Highest education level: Not on file  Occupational History  . Not on file  Social Needs  . Financial resource strain: Not on file  . Food insecurity:    Worry: Not on file    Inability: Not on file  . Transportation needs:    Medical: Not on file    Non-medical: Not on file  Tobacco Use  . Smoking status: Current Every Day Smoker    Packs/day: 0.25    Years: 3.00    Pack years: 0.75    Types: Cigarettes  . Smokeless tobacco: Never Used  Substance and Sexual Activity  . Alcohol use: Yes    Comment: occasional  . Drug use: No  . Sexual activity: Not on file  Lifestyle  . Physical activity:    Days per week: Not on file    Minutes per session: Not on file  . Stress: Not on file  Relationships  . Social connections:    Talks on phone: Not on file    Gets together: Not on file    Attends religious service: Not on file    Active  member of club or organization: Not on file    Attends meetings of clubs or organizations: Not on file    Relationship status: Not on file  Other Topics Concern  . Not on file  Social History Narrative  . Not on file    Vitals:   01/14/18 1434 01/14/18 1440  BP: 126/83   Pulse: (!) 103 96  Resp: 16   Temp: 98.4 F (36.9 C)   SpO2: 97%    Body mass index is 46.88 kg/m.   Physical Exam  Nursing note and vitals reviewed. Constitutional: She is oriented to person, place, and time. She appears well-developed. No distress.  HENT:  Head:  Normocephalic and atraumatic.  Mouth/Throat: Oropharynx is clear and moist and mucous membranes are normal.  Eyes: Pupils are equal, round, and reactive to light. Conjunctivae are normal.  Cardiovascular: Normal rate and regular rhythm.  No murmur heard. Pulses:      Dorsalis pedis pulses are 2+ on the right side, and 2+ on the left side.  Respiratory: Effort normal and breath sounds normal. No respiratory distress.  GI: Soft. She exhibits no mass. There is no hepatomegaly. There is no tenderness.  Musculoskeletal: She exhibits no edema.       Lumbar back: She exhibits tenderness (Left SI joint). She exhibits no bony tenderness.  Lymphadenopathy:    She has no cervical adenopathy.  Neurological: She is alert and oriented to person, place, and time. She has normal strength. No cranial nerve deficit. Gait normal.  Skin: Skin is warm. No rash noted. No erythema.  Psychiatric: She has a normal mood and affect.  Well groomed, good eye contact.   Diabetic Foot Exam - Simple   Simple Foot Form Diabetic Foot exam was performed with the following findings:  Yes 01/14/2018  3:28 PM  Visual Inspection No deformities, no ulcerations, no other skin breakdown bilaterally:  Yes Sensation Testing Intact to touch and monofilament testing bilaterally:  Yes Pulse Check Posterior Tibialis and Dorsalis pulse intact bilaterally:  Yes Comments         ASSESSMENT AND PLAN:   Ms. Julie Black was seen today for follow-up.  Orders Placed This Encounter  Procedures  . Tdap vaccine greater than or equal to 7yo IM  . Flu Vaccine QUAD 36+ mos IM  . Microalbumin / creatinine urine ratio  . Hepatitis C antibody  . Ambulatory referral to Gastroenterology   Lab Results  Component Value Date   MICROALBUR 0.9 01/14/2018   Lab Results  Component Value Date   HGBA1C 9.8 (H) 01/14/2018   Lab Results  Component Value Date   CREATININE 0.81 01/14/2018   BUN 12 01/14/2018   NA 137 01/14/2018   K 4.1 01/14/2018   CL 101 01/14/2018   CO2 30 01/14/2018   Lab Results  Component Value Date   CHOL 213 (H) 01/14/2018   HDL 41.00 01/14/2018   LDLCALC 138 (H) 01/14/2018   TRIG 172.0 (H) 01/14/2018   CHOLHDL 5 01/14/2018    Lower back pain She would like to see the same orthopedist her son is seen.  So she will let me know name of provider, so we can arrange appointment. Weight loss was recommended.   Vitamin D deficiency No changes in current management, will follow labs done today and will give further recommendations accordingly.   Hyperlipidemia She will continue atorvastatin 20 mg daily. Further recommendations will be given according to lab results.  Essential hypertension Adequately controlled. No changes in current management. Continue low-salt diet Eye exam is current. F/U in 6 months, before if needed.   Insulin dependent type 2 diabetes mellitus, uncontrolled (HCC) HgA1C has no been at goal. No changes in current management, treatment will be adjusted according to A1c results. Regular exercise and healthy diet with avoidance of added sugar food intake is an important part of treatment and recommended. Annual eye exam, periodic dental and foot care recommended. F/U in 5-6 months   Anxiety and depression Asymptomatic, she thinks she can continue weaning medication off. Celexa decreased from 20 mg to  10 mg, she can continue this dose for 2 to 3 weeks and then decrease it to  5 mg for 2 weeks, then every other day and then discontinue. She will monitor for symptom recurrence. Follow-up in 6 months, before if needed.  Need for Tdap vaccination - Tdap vaccine greater than or equal to 7yo IM  Flu vaccine need - Flu Vaccine QUAD 36+ mos IM  Encounter for HCV screening test for high risk patient - Hepatitis C antibody  Colon cancer screening - Ambulatory referral to Gastroenterology     Julie Black G. [place], MD  [facility]. [place] office.

## 2018-01-14 ENCOUNTER — Encounter: Payer: Self-pay | Admitting: Family Medicine

## 2018-01-14 ENCOUNTER — Ambulatory Visit: Payer: BLUE CROSS/BLUE SHIELD | Admitting: Family Medicine

## 2018-01-14 VITALS — BP 126/83 | HR 96 | Temp 98.4°F | Resp 16 | Ht 64.5 in | Wt 277.4 lb

## 2018-01-14 DIAGNOSIS — E559 Vitamin D deficiency, unspecified: Secondary | ICD-10-CM | POA: Diagnosis not present

## 2018-01-14 DIAGNOSIS — F329 Major depressive disorder, single episode, unspecified: Secondary | ICD-10-CM

## 2018-01-14 DIAGNOSIS — I10 Essential (primary) hypertension: Secondary | ICD-10-CM

## 2018-01-14 DIAGNOSIS — G8929 Other chronic pain: Secondary | ICD-10-CM

## 2018-01-14 DIAGNOSIS — M545 Low back pain, unspecified: Secondary | ICD-10-CM | POA: Insufficient documentation

## 2018-01-14 DIAGNOSIS — Z1159 Encounter for screening for other viral diseases: Secondary | ICD-10-CM

## 2018-01-14 DIAGNOSIS — E785 Hyperlipidemia, unspecified: Secondary | ICD-10-CM | POA: Diagnosis not present

## 2018-01-14 DIAGNOSIS — Z1211 Encounter for screening for malignant neoplasm of colon: Secondary | ICD-10-CM

## 2018-01-14 DIAGNOSIS — Z794 Long term (current) use of insulin: Secondary | ICD-10-CM

## 2018-01-14 DIAGNOSIS — F32A Depression, unspecified: Secondary | ICD-10-CM

## 2018-01-14 DIAGNOSIS — Z23 Encounter for immunization: Secondary | ICD-10-CM

## 2018-01-14 DIAGNOSIS — Z9189 Other specified personal risk factors, not elsewhere classified: Secondary | ICD-10-CM

## 2018-01-14 DIAGNOSIS — E1165 Type 2 diabetes mellitus with hyperglycemia: Secondary | ICD-10-CM

## 2018-01-14 DIAGNOSIS — IMO0002 Reserved for concepts with insufficient information to code with codable children: Secondary | ICD-10-CM

## 2018-01-14 DIAGNOSIS — F419 Anxiety disorder, unspecified: Secondary | ICD-10-CM

## 2018-01-14 LAB — COMPREHENSIVE METABOLIC PANEL
ALBUMIN: 3.9 g/dL (ref 3.5–5.2)
ALT: 23 U/L (ref 0–35)
AST: 17 U/L (ref 0–37)
Alkaline Phosphatase: 82 U/L (ref 39–117)
BILIRUBIN TOTAL: 0.4 mg/dL (ref 0.2–1.2)
BUN: 12 mg/dL (ref 6–23)
CO2: 30 mEq/L (ref 19–32)
CREATININE: 0.81 mg/dL (ref 0.40–1.20)
Calcium: 9.5 mg/dL (ref 8.4–10.5)
Chloride: 101 mEq/L (ref 96–112)
GFR: 75.79 mL/min (ref >60.00)
Glucose, Bld: 144 mg/dL — ABNORMAL HIGH (ref 70–99)
Potassium: 4.1 mEq/L (ref 3.5–5.1)
SODIUM: 137 meq/L (ref 135–145)
TOTAL PROTEIN: 7.1 g/dL (ref 6.0–8.3)

## 2018-01-14 LAB — MICROALBUMIN / CREATININE URINE RATIO
Creatinine,U: 114.1 mg/dL
Microalb Creat Ratio: 0.8 mg/g (ref 0.0–30.0)
Microalb, Ur: 0.9 mg/dL (ref 0.0–1.9)

## 2018-01-14 LAB — LIPID PANEL
CHOLESTEROL: 213 mg/dL — AB (ref 0–200)
HDL: 41 mg/dL (ref >39.00)
LDL Cholesterol: 138 mg/dL — ABNORMAL HIGH (ref 0–99)
NonHDL: 172.27
Total CHOL/HDL Ratio: 5
Triglycerides: 172 mg/dL — ABNORMAL HIGH (ref 0.0–149.0)
VLDL: 34.4 mg/dL (ref 0.0–40.0)

## 2018-01-14 LAB — VITAMIN D 25 HYDROXY (VIT D DEFICIENCY, FRACTURES): VITD: 22.77 ng/mL — AB (ref 30.00–100.00)

## 2018-01-14 LAB — HEMOGLOBIN A1C: HEMOGLOBIN A1C: 9.8 % — AB (ref 4.6–6.5)

## 2018-01-14 MED ORDER — CITALOPRAM HYDROBROMIDE 10 MG PO TABS: 10.0000 mg | ORAL_TABLET | Freq: Every day | ORAL | 0 refills | Status: DC

## 2018-01-14 NOTE — Assessment & Plan Note (Signed)
HgA1C has no been at goal. No changes in current management, treatment will be adjusted according to A1c results. Regular exercise and healthy diet with avoidance of added sugar food intake is an important part of treatment and recommended. Annual eye exam, periodic dental and foot care recommended. F/U in 5-6 months

## 2018-01-14 NOTE — Assessment & Plan Note (Addendum)
Asymptomatic, she thinks she can continue weaning medication off. Celexa decreased from 20 mg to 10 mg, she can continue this dose for 2 to 3 weeks and then decrease it to 5 mg for 2 weeks, then every other day and then discontinue. She will monitor for symptom recurrence. Follow-up in 6 months, before if needed.

## 2018-01-14 NOTE — Assessment & Plan Note (Signed)
No changes in current management, will follow labs done today and will give further recommendations accordingly.  

## 2018-01-14 NOTE — Assessment & Plan Note (Signed)
Adequately controlled. No changes in current management. Continue low-salt diet. Eye exam is current. F/U in 6 months, before if needed.  

## 2018-01-14 NOTE — Patient Instructions (Addendum)
A few things to remember from today's visit:   Insulin dependent type 2 diabetes mellitus, uncontrolled (HCC) - Plan: Hemoglobin A1c, Comprehensive metabolic panel, Microalbumin / creatinine urine ratio  Vitamin D deficiency - Plan: VITAMIN D 25 Hydroxy (Vit-D Deficiency, Fractures)  Hyperlipidemia, unspecified hyperlipidemia type - Plan: Lipid panel, Comprehensive metabolic panel  Essential hypertension - Plan: Comprehensive metabolic panel  Anxiety and depression - Plan: citalopram (CELEXA) 10 MG tablet  Decrease Celexa from 20 mg to 10 mg for 2 to 3 weeks, then you can continue weaning medication of taking half tablet (5 mg) daily for 2 weeks then every other day for 2 weeks and then discontinue.  Please be sure medication list is accurate. If a new problem present, please set up appointment sooner than planned today.

## 2018-01-14 NOTE — Assessment & Plan Note (Signed)
She would like to see the same orthopedist her son is seen.  So she will let me know name of provider, so we can arrange appointment. Weight loss was recommended.

## 2018-01-14 NOTE — Assessment & Plan Note (Signed)
She will continue atorvastatin 20 mg daily. Further recommendations will be given according to lab results.

## 2018-01-15 LAB — HEPATITIS C ANTIBODY
Hepatitis C Ab: NONREACTIVE
SIGNAL TO CUT-OFF: 0.02 (ref <1.00)

## 2018-02-19 ENCOUNTER — Other Ambulatory Visit: Payer: Self-pay | Admitting: Family Medicine

## 2018-02-19 DIAGNOSIS — I10 Essential (primary) hypertension: Secondary | ICD-10-CM

## 2018-03-05 ENCOUNTER — Telehealth: Payer: Self-pay | Admitting: Family Medicine

## 2018-03-10 ENCOUNTER — Other Ambulatory Visit: Payer: Self-pay | Admitting: *Deleted

## 2018-03-10 DIAGNOSIS — E785 Hyperlipidemia, unspecified: Secondary | ICD-10-CM

## 2018-03-10 NOTE — Telephone Encounter (Signed)
Copied from CRM [id number]. Topic: General - Other >> Mar 10, 2018  1:37 PM Percival Spanish wrote: Pt req  refills   atorvastatin (LIPITOR) 20 MG tablet     LANTUS SOLOSTAR 100 UNIT/ML Solostar Pen     Pt said this RX was changed to 65 instead of 50 which was sent to the pharmacy 50     liraglutide (VICTOZA) 18 MG/3ML SOPN   [facility]

## 2018-03-16 NOTE — Telephone Encounter (Signed)
Pt calling to request refill of Atorvastatin that was originally requested on 03/10/18

## 2018-03-16 NOTE — Telephone Encounter (Signed)
Requested medication (s) are due for refill today: Yes  Requested medication (s) are on the active medication list: Yes  Last refill:  04/25/17  Future visit scheduled: Yes  Notes to clinic:  Noted on lab result notes to increase Lipitor to 40 mg/day, will need new Rx reflecting this change.     Requested Prescriptions  Pending Prescriptions Disp Refills   atorvastatin (LIPITOR) 20 MG tablet 90 tablet 2    Sig: Take 1 tablet (20 mg total) by mouth daily.     Cardiovascular:  Antilipid - Statins Failed - 03/16/2018  5:39 PM      Failed - Total Cholesterol in normal range and within 360 days    Cholesterol  Date Value Ref Range Status  01/14/2018 213 (H) 0 - 200 mg/dL Final    Comment:    ATP III Classification       Desirable:  < 200 mg/dL               Borderline High:  200 - 239 mg/dL          High:  > = 161 mg/dL         Failed - LDL in normal range and within 360 days    LDL Cholesterol  Date Value Ref Range Status  01/14/2018 138 (H) 0 - 99 mg/dL Final         Failed - Triglycerides in normal range and within 360 days    Triglycerides  Date Value Ref Range Status  01/14/2018 172.0 (H) 0.0 - 149.0 mg/dL Final    Comment:    Normal:  <150 mg/dLBorderline High:  150 - 199 mg/dL         Passed - HDL in normal range and within 360 days    HDL  Date Value Ref Range Status  01/14/2018 41.00 >39.00 mg/dL Final         Passed - Patient is not pregnant      Passed - Valid encounter within last 12 months    Recent Outpatient Visits          2 months ago Insulin dependent type 2 diabetes mellitus, uncontrolled (HCC)   [facility] [place], Timoteo Expose, MD   6 months ago Insulin dependent type 2 diabetes mellitus, uncontrolled (HCC)   [facility] at [place], Timoteo Expose, MD   10 months ago Insulin dependent type 2 diabetes mellitus, uncontrolled (HCC)   [facility] at [place], Timoteo Expose, MD   1 year ago Uncontrolled type 2  diabetes mellitus with hyperglycemia, with long-term current use of insulin (HCC)   Adult nurse [facility] [place], Timoteo Expose, MD   1 year ago Uncontrolled type 1 diabetes mellitus with complication (HCC)   [facility] at [place], Timoteo Expose, MD      Future Appointments            In 4 months [place], Timoteo Expose, MD [facility] [place], [facility]

## 2018-03-16 NOTE — Telephone Encounter (Signed)
Pt called in to check status of refill request.

## 2018-03-17 MED ORDER — ATORVASTATIN CALCIUM 20 MG PO TABS: 20.0000 mg | ORAL_TABLET | Freq: Every day | ORAL | 2 refills | Status: AC

## 2018-03-17 NOTE — Telephone Encounter (Signed)
Pt states that her insulin should be written to give 65 units, but the script went in as give 50 units.  Pt states she even had trouble getting it refilled because the insurance company states she is using too much based off of what is being called in. Pt would like to speak with someone about this. Pt can be reached at [phone]

## 2018-03-18 NOTE — Telephone Encounter (Signed)
Message sent to Dr. Jordan for review. 

## 2018-03-18 NOTE — Telephone Encounter (Signed)
It was noted on lab result note to increase Lipitor to 40 mg and no Rx was ever sent for that increased dose. Unsure if the patient was aware to increase as she requested the 20 mg.

## 2018-03-18 NOTE — Telephone Encounter (Signed)
I thought she reported taking Lantus 55 units. It is okay to adjust the amount of Lantus as reported. Thanks, BJ

## 2018-03-19 ENCOUNTER — Other Ambulatory Visit: Payer: Self-pay | Admitting: *Deleted

## 2018-03-19 MED ORDER — INSULIN GLARGINE 100 UNIT/ML SOLOSTAR PEN: 65.0000 [IU] | PEN_INJECTOR | Freq: Every day | SUBCUTANEOUS | 1 refills | Status: DC

## 2018-03-19 NOTE — Telephone Encounter (Signed)
Rx sent for 65 units sent to the pharmacy.

## 2018-03-30 ENCOUNTER — Other Ambulatory Visit: Payer: Self-pay | Admitting: Family Medicine

## 2018-04-16 ENCOUNTER — Encounter: Payer: Self-pay | Admitting: Family Medicine

## 2018-04-17 ENCOUNTER — Other Ambulatory Visit: Payer: Self-pay | Admitting: Family Medicine

## 2018-04-20 ENCOUNTER — Other Ambulatory Visit: Payer: Self-pay | Admitting: Family Medicine

## 2018-05-04 ENCOUNTER — Other Ambulatory Visit: Payer: Self-pay | Admitting: Family Medicine

## 2018-05-04 DIAGNOSIS — G43109 Migraine with aura, not intractable, without status migrainosus: Secondary | ICD-10-CM

## 2018-05-11 ENCOUNTER — Other Ambulatory Visit: Payer: Self-pay | Admitting: *Deleted

## 2018-05-11 ENCOUNTER — Telehealth: Payer: Self-pay

## 2018-05-11 DIAGNOSIS — G43109 Migraine with aura, not intractable, without status migrainosus: Secondary | ICD-10-CM

## 2018-05-11 MED ORDER — AMITRIPTYLINE HCL 10 MG PO TABS: ORAL_TABLET | ORAL | 1 refills | Status: AC

## 2018-05-11 NOTE — Telephone Encounter (Signed)
Copied from CRM [phone]. Topic: General - Other >> May 11, 2018 11:03 AM Jilda Rocheemaray, Melissa wrote: Reason for CRM: Pt called about her refill and states she is out of medication and she will get a migraine

## 2018-05-11 NOTE — Telephone Encounter (Signed)
Rx sent to pharmacy   

## 2018-05-18 ENCOUNTER — Other Ambulatory Visit: Payer: Self-pay | Admitting: Family Medicine

## 2018-05-18 DIAGNOSIS — F419 Anxiety disorder, unspecified: Principal | ICD-10-CM

## 2018-05-18 DIAGNOSIS — F32A Depression, unspecified: Secondary | ICD-10-CM

## 2018-05-18 DIAGNOSIS — F329 Major depressive disorder, single episode, unspecified: Secondary | ICD-10-CM

## 2018-05-19 ENCOUNTER — Other Ambulatory Visit: Payer: Self-pay | Admitting: Family Medicine

## 2018-05-28 ENCOUNTER — Other Ambulatory Visit: Payer: Self-pay | Admitting: Family Medicine

## 2018-07-15 ENCOUNTER — Encounter: Payer: BLUE CROSS/BLUE SHIELD | Admitting: Family Medicine

## 2018-08-15 ENCOUNTER — Other Ambulatory Visit: Payer: Self-pay | Admitting: Family Medicine

## 2018-10-09 ENCOUNTER — Other Ambulatory Visit: Payer: Self-pay | Admitting: Family Medicine

## 2019-06-02 IMAGING — US US THYROID
1 series · 13 of 25 positions shown · non-contrast
Comparison: None.

CLINICAL DATA: Hypothyroid.  Status post right thyroidectomy.

EXAM:
THYROID ULTRASOUND
TECHNIQUE: Ultrasound examination of the thyroid gland and adjacent soft
tissues was performed.

[Series 1: us thyroid · 0.05mm/px · 13 of 45 slices shown]
[im 1/45]
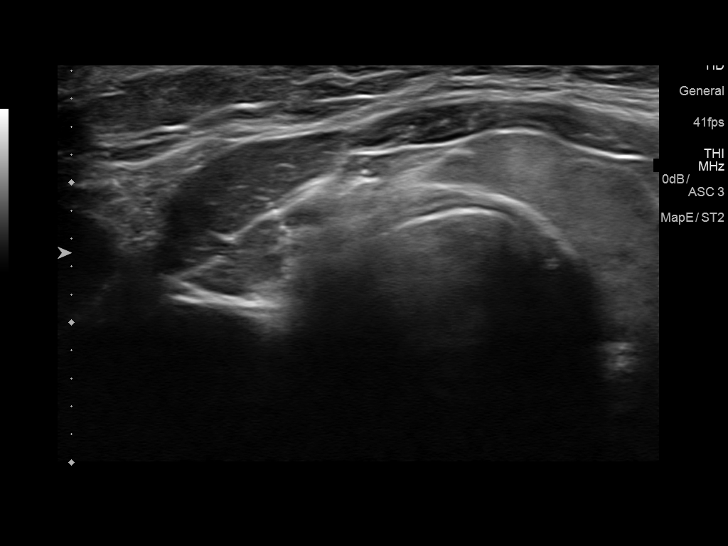
[im 4/45]
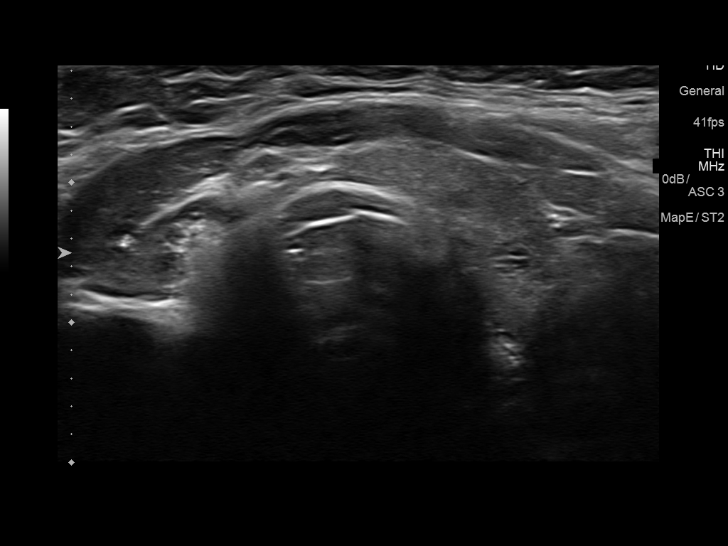
[im 8/45]
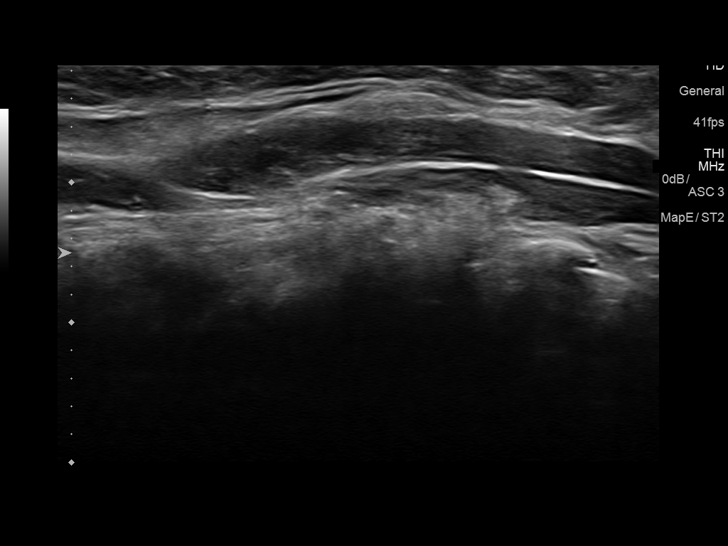
[im 12/45]
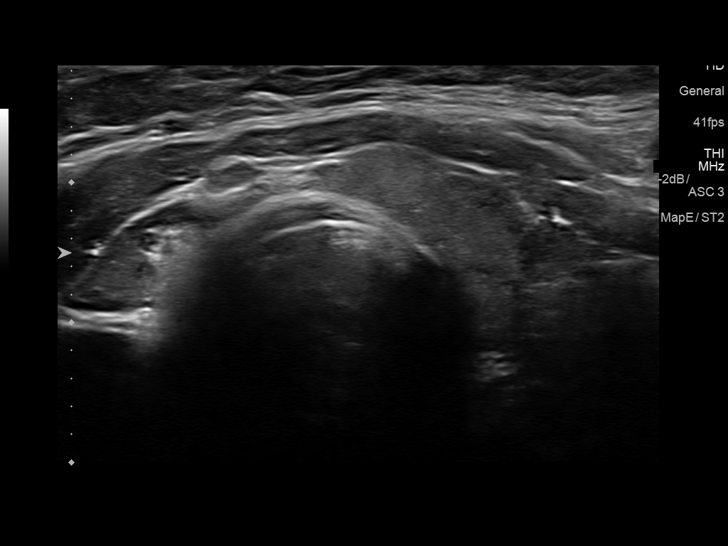
[im 15/45]
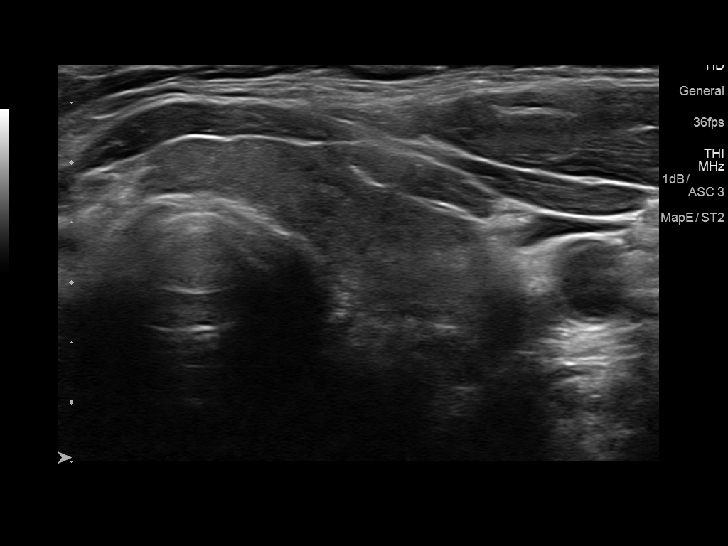
[im 19/45]
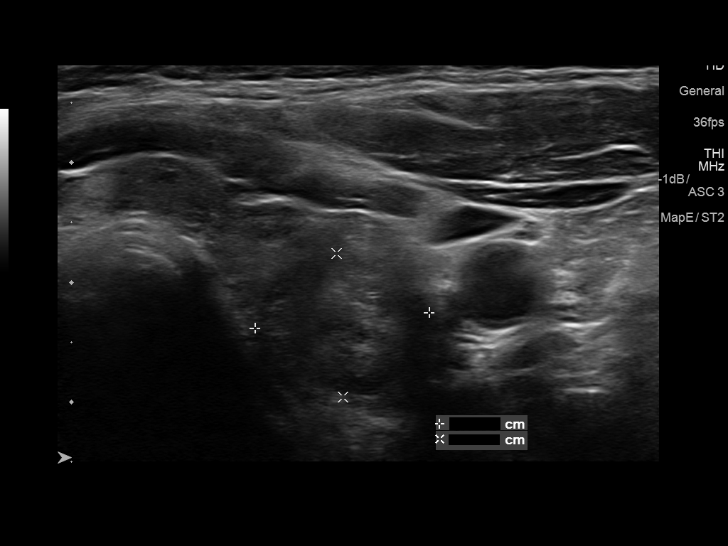
[im 23/45]
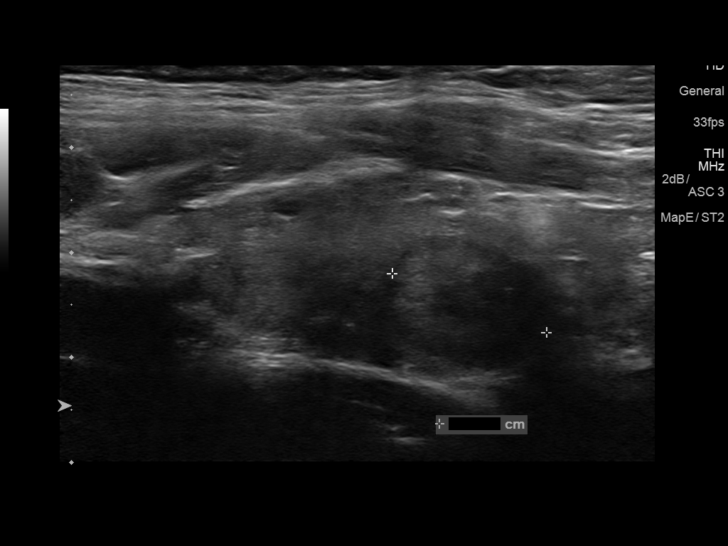
[im 26/45]
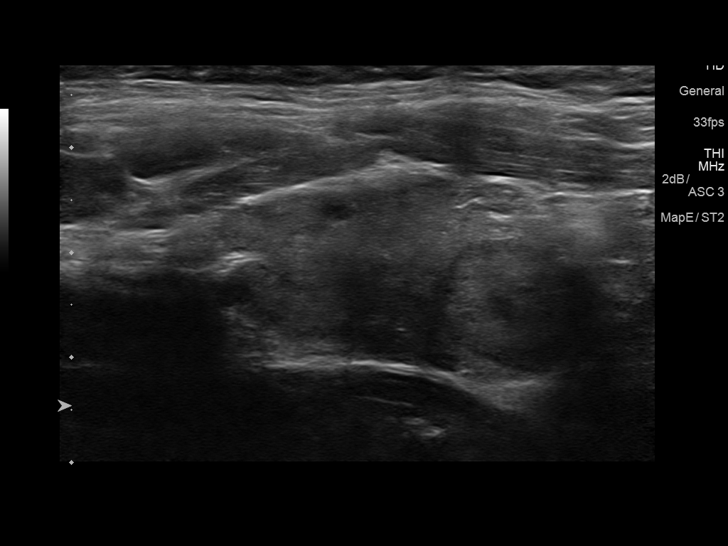
[im 30/45]
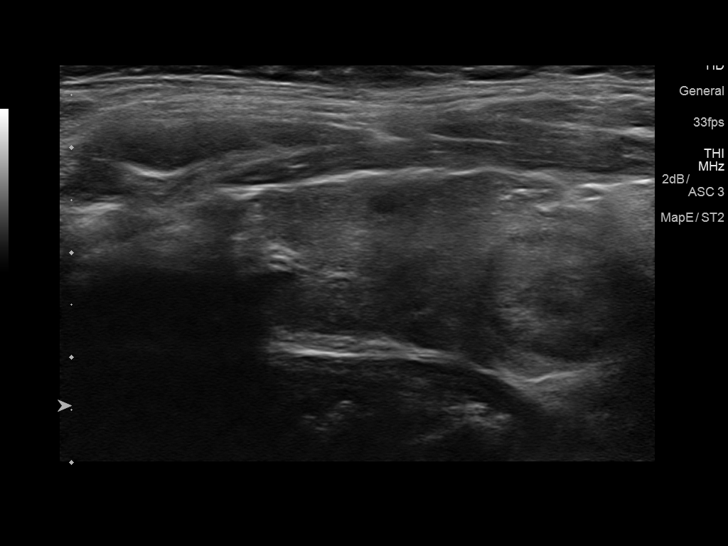
[im 34/45]
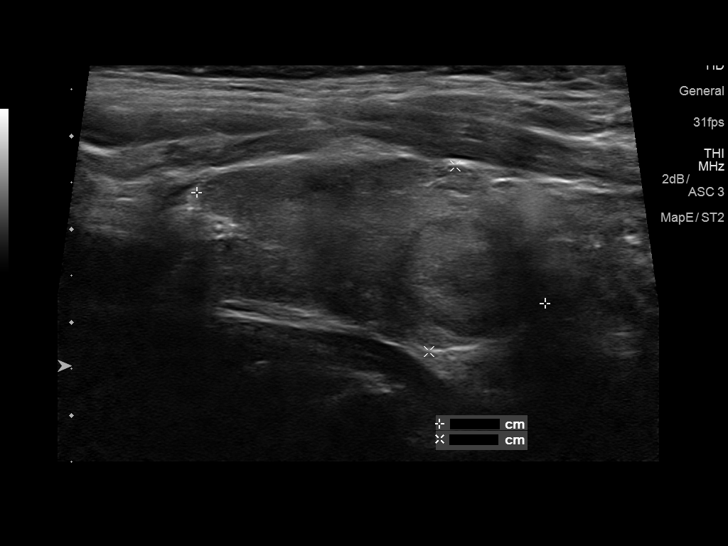
[im 37/45]
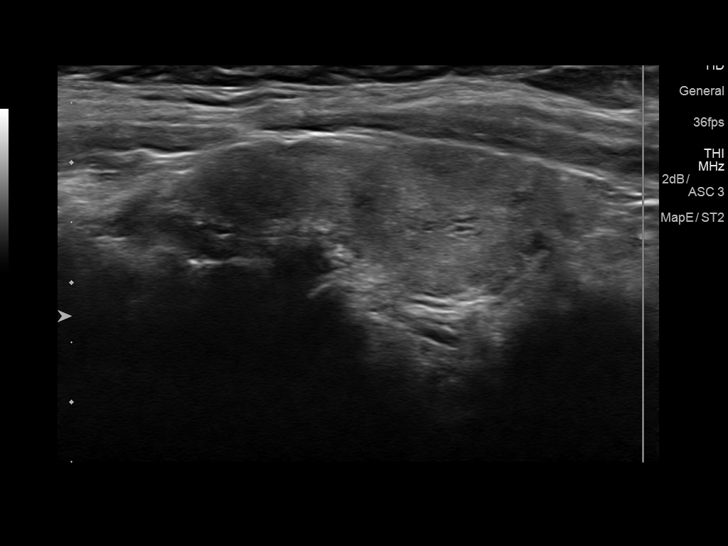
[im 41/45]
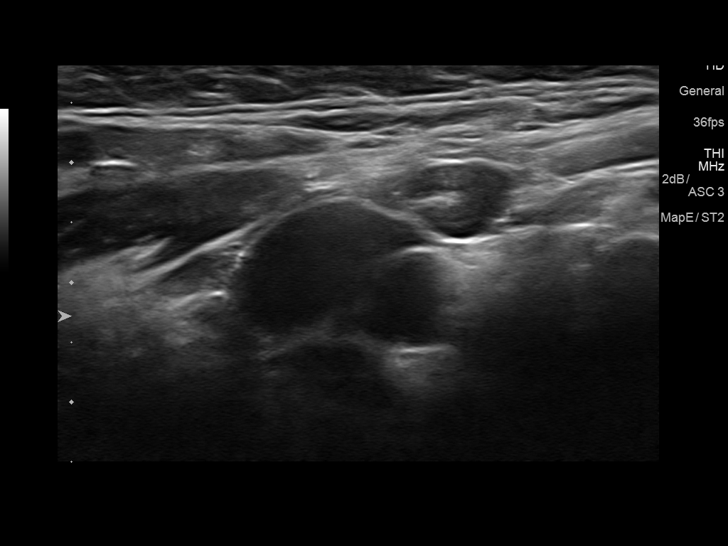
[im 45/45]
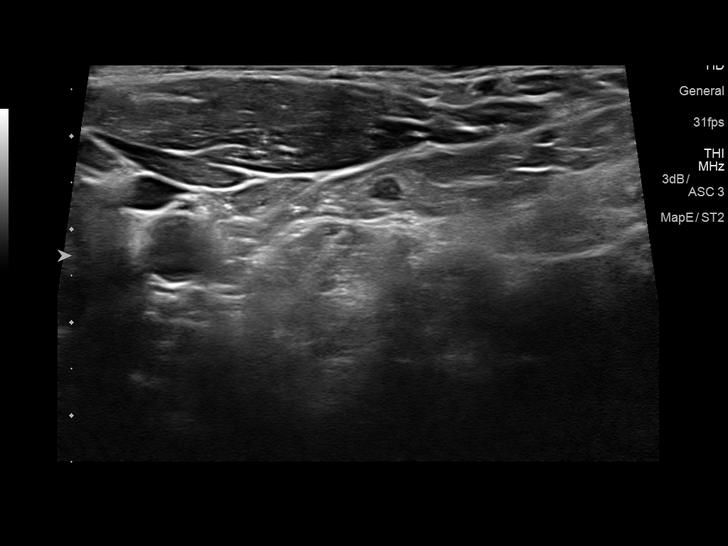

[13 of 25 positions shown; findings below may reference images not displayed]

FINDINGS: Parenchymal Echotexture: Mildly heterogenous

Isthmus: 0.4 cm

Right lobe: Absent tissue.

Left lobe: 3.9 x 2.0 x 1.5 cm

_________________________________________________________

Estimated total number of nodules >/= 1 cm: 1

Number of spongiform nodules >/=  2 cm not described below (TR1): 0

Number of mixed cystic and solid nodules >/= 1.5 cm not described
below (TR2): 0

_________________________________________________________

Nodule # 1:

Location: Left; Inferior

Maximum size: 1.6 cm; Other 2 dimensions: 1.5 x 1.2 cm

Composition: solid/almost completely solid (2)

Echogenicity: hypoechoic (2)

Shape: not taller-than-wide (0)

Margins: smooth (0)

Echogenic foci: none (0)

ACR TI-RADS total points: 4.

ACR TI-RADS risk category: TR4 (4-6 points).

ACR TI-RADS recommendations:

**Given size (>/= 1.5 cm) and appearance, fine needle aspiration of
this moderately suspicious nodule should be considered based on
TI-RADS criteria.

_________________________________________________________
IMPRESSION: Left lower pole nodule 1 meets criteria for fine needle aspiration
biopsy.

There is no residual tissue in the right thyroid bed to suggest
recurrence.

The above is in keeping with the ACR TI-RADS recommendations - [HOSPITAL] 3044;[DATE].

## 2019-06-17 ENCOUNTER — Other Ambulatory Visit: Payer: Self-pay | Admitting: Family Medicine

## 2019-08-23 ENCOUNTER — Other Ambulatory Visit: Payer: Self-pay | Admitting: *Deleted

## 2019-08-23 DIAGNOSIS — E119 Type 2 diabetes mellitus without complications: Secondary | ICD-10-CM

## 2019-08-23 NOTE — Patient Outreach (Signed)
[facility]) Care Management Chronic Special Needs Program    08/23/2019  Name: Julie Black, [place]: 10-12-1955  MRN: [id number]   Julie Black is enrolled in a chronic special needs plan for Diabetes.  Health risk assessment previously completed by client.  Individualized care plan created from information in electronic medical record.  Client also has history of hypertension.  RN care manager will send introductory letter with individualized care plan to primary care provider and client along with education materials.  Assigned RN care manager will follow up in 3-4 months.  Goals    .  Acknowledge receipt of Art gallery manager mailed client Advanced Directives packet. RN care manager mailed EMMI education article " Advanced Directives"     . " Working on quitting smoking, losing weight and just want to be healthier and off some medications" (pt-stated)     RN care manager mailed EMMI education articles to client home " Weight loss tips" "Quitting smoking" Call [employer] conciegre for any benefit questions at [phone]. Try to eat a healthy diet including lean protein, fruits and vegetables with plenty of fiber. Practice portion control Try to get some physical activity daily Talk with your doctor about an exercise program RN care manager placed order for RN health coach for resources/ reinforcement related to smoking cessation, losing weight and having a healthier lifestyle. Pharmacist will contact you about inability to afford medications and you stating you want to take less medications, they will review all medications with you.    Marland Kitchen Client understands the importance of follow-up with providers by attending scheduled visits     Review of medical record indicates client has completed 1 office visits with primary care provider in 2020 Call and schedule a yearly physical and follow up visit as recommended by  your health care provider.     . Client will verbalize knowledge of self management of Hypertension as evidences by BP reading of 140/90 or less; or as defined by provider     Take blood pressure medications as prescribed. Plan to check blood pressure regularly and take results to your health care provider appointments. Plan to follow a low salt diet. Increase activity as tolerated. Follow up with your health care provider as recommended. Please ask your health care provider " what is my target blood pressure range". Per medical record review, blood pressure 110/69 on 07/31/18    . HEMOGLOBIN A1C < 7.0     Per medical record review Hgb AIC completed on 07/20/18  9.6 Diabetes self management actions as follows- Continue to keep your follow up appointments with your provider and have lab work completed as recommended. Monitor glucose (blood sugar) per health care provider recommendation. Check feet daily Visit health care provider every 3-6 months as directed. It is important to have your Hgb AIC checked every 3-6 months (every 6 months if you are at goal and every 3 months if you are not at goal). Eye exam yearly Carbohydrate controlled meal planning Take diabetes medications as prescribed by health care provider Physical activity RN care manager mailed EMMI education article " Diabetes- controlling blood sugar"     . Maintain timely refills of diabetic medication as prescribed within the year .     Take medications as prescribed. Follow up with your health care provider if you have any questions. Please contact your assigned RN care manager if you have difficulty obtaining medications. RN care Freight forwarder  placed order for pharmacist to contact client    . Obtain annual  Lipid Profile, LDL-C     Per medical record review, Lipid profile completed on 07/20/18 LDL= 120 The goal for LDL is less than 70mg /dl as you are at high risk for complications. Try to avoid saturated fats, trans-fats and  eat more fiber. Continue to follow up with your health care provider for any needed lab work.     . Obtain Annual Eye (retinal)  Exam      Per medical record review, client had eye exam on 12/22/17 Plan to have dilated eye exam every year Plan to schedule your eye exam yearly     . Obtain Annual Foot Exam     Client states provider checks feet 1 time per year. Per medical record review, foot exam completed on 01/14/18 Your doctor should check your feet at least once a year. Plan to schedule a foot exam with your health care provider once every year. Check your skin and feet daily for cuts, bruises, redness, blisters or sore.     . Obtain annual screen for micro albuminuria (urine) , nephropathy (kidney problems)     Per medical record review, microalbuminuria completed on 07/20/18 Continue to obtain yearly physicals and lab checks as recommended by your health care provider. It is important for your health care provider to check your urine for protein at least once every year.     09/19/18 Hemoglobin A1C at least 2 times per year     Hgb AIC completed in 2020 Please follow up with your health care provider to have [facility] checked Per medical record review, client is to follow up with primary care provider 08/30/19 and endocrinologist 10/04/19    . Visit Primary Care Provider or Endocrinologist at least 2 times per year      Client saw primary care provider 07/31/18 Please follow up with primary care provider and endocrinologist for assessment and any needed lab work      RN care manager sent in basket to 08/02/18 CMA to order RN health coach for smoking cessation, weight loss, placed order for pharmacist ([facility]) for medication assistance and emailed [facility] for pharmacy referral for polypharmacy and pt feels she is taking too many medications. RN care manager faxed today's note and individualized care plan to primary care provider. RN care manager mailed introductory letter and individualized  care plan, EMMI education articles, magnet and pamphlet to client's home.   PLAN Follow up in 3-4 months  Iverson Alamin [facility], BSN [facility] Care Coordinator [phone]

## 2019-08-24 NOTE — Patient Outreach (Signed)
Pharmacy referral for polypharmacy and pharmacy assistance was sent to [facility].

## 2019-08-30 ENCOUNTER — Ambulatory Visit: Payer: Self-pay | Admitting: Pharmacist

## 2019-08-30 ENCOUNTER — Telehealth: Payer: Self-pay | Admitting: Pharmacist

## 2019-08-30 NOTE — Patient Outreach (Deleted)
[facility]) Care Management  [facility]   08/30/2019  Zeynep Fantroy May 08, 1956 [id number]  Subjective:  Objective:   Encounter Medications: Outpatient Encounter Medications as of 08/30/2019  Medication Sig Note  . amitriptyline (ELAVIL) 10 MG tablet TAKE 3 TABLETS BY MOUTH ONCE DAILY AT BEDTIME   . aspirin EC 81 MG tablet Take by mouth.   Marland Kitchen atorvastatin (LIPITOR) 20 MG tablet Take 1 tablet (20 mg total) by mouth daily.   . BD PEN NEEDLE NANO U/F 32G X 4 MM MISC USE AS DIRECTED   . BIOTIN PO Take by mouth.   . Cholecalciferol 25 MCG (1000 UT) capsule Take by mouth.   . citalopram (CELEXA) 40 MG tablet Take 20 mg by mouth daily.   Marland Kitchen CLOBETASOL PROPIONATE E 0.05 % emollient cream APPLY  CREAM TOPICALLY TO AFFECTED AREA AS DIRECTED   . Continuous Blood Gluc Receiver (FREESTYLE LIBRE 2 READER) DEVI SCAN AS NEEDED FOR CONTINUOUS GLUCOSE MONITORING.   . cyanocobalamin (,VITAMIN B-12,) 1000 MCG/ML injection Inject 1,000 mcg into the muscle.   . iron polysaccharides (NIFEREX) 150 MG capsule Take 150 mg by mouth daily.   Marland Kitchen LANTUS SOLOSTAR 100 UNIT/ML Solostar Pen INJECT 65 UNITS SUBCUTANEOUSLY ONCE DAILY   . levothyroxine (SYNTHROID, LEVOTHROID) 112 MCG tablet TAKE 1 TABLET BY MOUTH ONCE DAILY BEFORE BREAKFAST   . liraglutide (VICTOZA) 18 MG/3ML SOPN Inject 0.2 mLs (1.2 mg total) into the skin daily. 08/30/2019: Pharmacy refill states $115   . lisinopril-hydrochlorothiazide (PRINZIDE,ZESTORETIC) 20-12.5 MG tablet TAKE 1 TABLET BY MOUTH ONCE DAILY   . metFORMIN (GLUCOPHAGE) 1000 MG tablet TAKE 1 TABLET BY MOUTH TWICE DAILY WITH FOOD   . [DISCONTINUED] citalopram (CELEXA) 10 MG tablet TAKE 1 TABLET BY MOUTH ONCE DAILY 08/30/2019: 20 mg  . pantoprazole (PROTONIX) 40 MG tablet TAKE 1 TABLET BY MOUTH ONCE DAILY   . vitamin C (ASCORBIC ACID) 500 MG tablet Take by mouth.   . [DISCONTINUED] B Complex-Biotin-FA (B-COMPLEX PO) Take by mouth.   . [DISCONTINUED] Vitamin D,  Ergocalciferol, (DRISDOL) 50000 units CAPS capsule Take 50,000 Units by mouth daily. 08/30/2019: D3   No facility-administered encounter medications on file as of 08/30/2019.    Functional Status: No flowsheet data found.  Fall/Depression Screening: No flowsheet data found. No flowsheet data found.    Assessment:  Plan:

## 2019-09-01 NOTE — Patient Outreach (Signed)
[facility]) Care Management   09/01/2019 - late entry  Julie Black [facility] 01/27/1956 [id number]   Subjective: Julie Black is an 64 y.o. female referred to [facility] services for medication review and medication assistance (Victoza)   Objective:    Lab Results  Component Value Date   HGBA1C 9.8 (H) 01/14/2018   HGBA1C 9.1 (H) 04/25/2017   HGBA1C 12.4 (H) 12/19/2016   Lab Results  Component Value Date   MICROALBUR 0.9 01/14/2018   LDLCALC 138 (H) 01/14/2018   CREATININE 0.81 01/14/2018     Encounter Medications:   Outpatient Encounter Medications as of 08/30/2019  Medication Sig Note  . amitriptyline (ELAVIL) 10 MG tablet TAKE 3 TABLETS BY MOUTH ONCE DAILY AT BEDTIME   . aspirin EC 81 MG tablet Take by mouth.   Marland Kitchen atorvastatin (LIPITOR) 20 MG tablet Take 1 tablet (20 mg total) by mouth daily.   . BD PEN NEEDLE NANO U/F 32G X 4 MM MISC USE AS DIRECTED   . BIOTIN PO Take by mouth.   . Cholecalciferol 25 MCG (1000 UT) capsule Take by mouth.   . citalopram (CELEXA) 40 MG tablet Take 20 mg by mouth daily.   Marland Kitchen CLOBETASOL PROPIONATE E 0.05 % emollient cream APPLY  CREAM TOPICALLY TO AFFECTED AREA AS DIRECTED   . Continuous Blood Gluc Receiver (FREESTYLE LIBRE 2 READER) DEVI SCAN AS NEEDED FOR CONTINUOUS GLUCOSE MONITORING.   . cyanocobalamin (,VITAMIN B-12,) 1000 MCG/ML injection Inject 1,000 mcg into the muscle.   . iron polysaccharides (NIFEREX) 150 MG capsule Take 150 mg by mouth daily.   Marland Kitchen LANTUS SOLOSTAR 100 UNIT/ML Solostar Pen INJECT 65 UNITS SUBCUTANEOUSLY ONCE DAILY   . levothyroxine (SYNTHROID, LEVOTHROID) 112 MCG tablet TAKE 1 TABLET BY MOUTH ONCE DAILY BEFORE BREAKFAST   . liraglutide (VICTOZA) 18 MG/3ML SOPN Inject 0.2 mLs (1.2 mg total) into the skin daily. 08/30/2019: Pharmacy refill states $115   . lisinopril-hydrochlorothiazide (PRINZIDE,ZESTORETIC) 20-12.5 MG tablet TAKE 1 TABLET BY MOUTH ONCE DAILY   . metFORMIN  (GLUCOPHAGE) 1000 MG tablet TAKE 1 TABLET BY MOUTH TWICE DAILY WITH FOOD   . [DISCONTINUED] citalopram (CELEXA) 10 MG tablet TAKE 1 TABLET BY MOUTH ONCE DAILY 08/30/2019: 20 mg  . pantoprazole (PROTONIX) 40 MG tablet TAKE 1 TABLET BY MOUTH ONCE DAILY   . vitamin C (ASCORBIC ACID) 500 MG tablet Take by mouth.   . [DISCONTINUED] B Complex-Biotin-FA (B-COMPLEX PO) Take by mouth.   . [DISCONTINUED] Vitamin D, Ergocalciferol, (DRISDOL) 50000 units CAPS capsule Take 50,000 Units by mouth daily. 08/30/2019: D3   No facility-administered encounter medications on file as of 08/30/2019.    Assessment:   Patient states that she has 1 more day of Victoza. After consultation with representative at [facility], she has partially fallen into the coverage gap, so her copay this month is $115. On her CSNP plan, oral DM meds and insulin will continue to be $0 copay through the coverage gap but unfortunately 1 month supply of Victoza is $247.   Plan:  1. Patient to obtain samples from MD office 2. Will provide patient with paperwork to apply for Victoza assistance program  Karalee Height, PharmD Clinical Pharmacist [facility] [phone]

## 2019-09-20 ENCOUNTER — Other Ambulatory Visit: Payer: Self-pay | Admitting: *Deleted

## 2019-09-20 NOTE — Patient Outreach (Signed)
RN care manager received note from [employer] stating during her conversation with client it was reported to her client has lump behind her leg causing pain and keeping client from exercising and walking up and down stairs comfortably, client saw primary care provider for physical and had ultrasound that did not show anything and plan is "to watch it" for now, client not sure what to do about the pain or if she should see orthopedist or get second opinion. Outreach call to client and addressed the above issues, client states she is applying heat and CBD oil and "I feel much better", "going up and down steps a little better"  Client states she will continue following this plan and will follow up with primary care provider in June.  Client states hopefully this will continue to feel better and if not she will talk with primary care provider about referral to orthopedist. RN care manager offered support and any assistance needed.  RN care manager offered to complete assessments today and client states she is receiving a lot of phone calls at present and going to a lot of appointments (client is to see gynecologist, endocrinologist, GI, primary care) and would prefer to wait until her scheduled appointment with RN care manager beginning of July.  [name] [facility], BSN [facility] Community Care Coordinator [phone]

## 2019-11-17 ENCOUNTER — Other Ambulatory Visit: Payer: Self-pay | Admitting: *Deleted

## 2019-11-17 ENCOUNTER — Encounter: Payer: Self-pay | Admitting: *Deleted

## 2019-11-17 NOTE — Patient Outreach (Signed)
[facility]) Care Management Chronic Special Needs Program  11/17/2019  Name: Jalaysia Lobb DOB: Aug 06, 1955  MRN: [id number]  Ms. Blaize Nipper is enrolled in a chronic special needs plan for Diabetes. Chronic Care Management Coordinator telephoned client to review health risk assessment and to develop individualized care plan.  Introduced the chronic care management program, importance of client participation, and taking their care plan to all provider appointments and inpatient facilities.  Reviewed the transition of care process and possible referral to community care management.  Subjective: Client reports she lives with her son, is independent in all aspects of her care, continues to drive.  Client reports she is very happy that her AIC decreased to 7.5 in June 2021 from 9.6 in March 2020.  Client states she is on ozempic " and this has made all the difference", client also attributes losing weight (lost 15 pounds) and smoking cessation with her success and she continues working with Orthoptist.  Client is on the SERT food diet with lots of vegetables and adequate protein.  Client continues to see endocrinologist and primary care provider regularly.  Client reports blood sugar drops below 70 "maybe twice monthly", client verbalizes understanding of actions to take for hypoglycemia and reports she does discuss with doctor.  RN care manager reminded client of action plan for hypoglycemia in back of [facility] calendar and reviewed with client.  Client recently stopped smoking and is using lozenges with good success. Client continues to use CPAP and is to have another sleep study in August 2021. Client changed to Tier 1 today due to improvement in [facility] and client has all medications, is able to afford at present and verbalizes good understanding of medications and diet, has joined [employer] and is trying to be proactive with her health and is making good progress.  Goals  Addressed              This Visit's Progress   .  COMPLETED:  Acknowledge receipt of Advanced Directive package        Client reports she did receive Biochemist, clinical and will contact RN care manager if any difficulty completing     .  " Working on quitting smoking, losing weight and just want to be healthier and off some medications" (pt-stated)        RN care Production designer, theatre/television/film mailed EMMI education articles to client " Exercise" Call [employer] conciegre for any benefit questions at [phone]. Try to eat a healthy diet including lean protein, fruits and vegetables with plenty of fiber. Practice portion control Try to get some physical activity daily Client continues to work with [facility] health coach     .  Client understands the importance of follow-up with providers by attending scheduled visits        Review of medical record indicates client has completed 1 office visits with primary care provider in 2020 and 1 office visit in 2021. Call and schedule a yearly physical and follow up visit as recommended by your health care provider.     .  Client will verbalize knowledge of self management of Hypertension as evidences by BP reading of 140/90 or less; or as defined by provider   On track     Plan to check blood pressure regularly.  If you do not have a B/P monitor (cuff), one can be provided to you.  Write results in your [employer] calendar (in the back section). Reviewed blood pressure  medication from EMR. Take B/P medications as ordered.  Some may cause you to use the bathroom more. Plan to eat low salt and heart healthy meals full of fruits, vegetables, whole grains, lean protein and limit fat and sugars. Increase activity as tolerated. Reviewed lifestyle modification- smoking cessation, weight control and reducing stress. RN care manager provided education " Controlling your blood pressure through lifestyle" Use 24 hour nurse line as needed at  [phone]     .  HEMOGLOBIN A1C < 7        Your last documented AIC is 9.6 on 07/20/18, client reports most recent [facility] is 7.5 on June 2021. Have your [facility] checked every 6 months if you are at goal or every 3 months if you are not at goal. Check blood sugars daily before eating with goal of 80-130.  You can also check 1 1/2 hours after eating with goal of 180 or less. Plan to eat low carbohydrate and low salt meals, watch portion sizes and avoid sugar sweetened drinks.  Discussed carbohydrate control meals. Reviewed signs and symptoms of hyperglycemia (high blood sugar) and hypoglycemia (low blood sugar) and actions to take. Review [employer] calendar (sent in the mail) for diabetes action plan in the back. [facility] Health Coach continues working with client Increase activity only if you are able to do it.  Follow doctor recommendations. EMMI education provided on "Diabetic Meal Planning".  Review and plan to discuss with RN during next telephonic assessment.        .  Maintain timely refills of diabetic medication as prescribed within the year .        Contact your RN care manager if you have questions about medicines. Medication review completed from EMR information. It is important to take your medications as prescribed. Reviewed use and possible side effects of diabetes medications.      Clydene Pugh Annual Eye (retinal)  Exam         Your last documented eye exam per client report June 2020 Diabetes can affect your vision.  Plan to have a dilated eye exam every year. Advised client to keep and/ or schedule appointment with eye doctor.      .  Obtain Annual Foot Exam        Your doctor should check your bare feet at each visit. Diabetes can affect the nerves in your feet, causing decreased feeling or numbness. Check your feet and in-between toes daily for cuts, bruises, redness, blisters or sores.  If you cannot reach them, use a mirror. Wash feet with soap and water, dry  feet well especially between toes.  Don't use too much lotion. Wear shoes that are not too tight and don't walk barefoot.       .  Obtain annual screen for micro albuminuria (urine) , nephropathy (kidney problems)   On track     Diabetes can affect your kidneys. It is important for your doctor to check your urine at least once a year  These tests show how your kidneys are working.      Clydene Pugh Hemoglobin A1C at least 2 times per year        Hgb AIC completed in March 2020 and June 2021 Please follow up with your health care provider to have [facility] checked    .  Visit Primary Care Provider or Endocrinologist at least 2 times per year         Client saw primary care provider 07/31/18  and 08/17/19 Please follow up with primary care provider and endocrinologist for assessment and any needed lab work       Plan:    Medical illustrator faxed today's note with updated individualized care plan to primary care provider, mailed updated individualized care plan to client along with education materials, consent form, [facility] calendar.   Chronic care management coordination will outreach in:  9-12 months     Audrie Gallus Nursing/RN Coord [facility] Case Manager, C-SNP  [phone]

## 2020-04-12 ENCOUNTER — Other Ambulatory Visit: Payer: Self-pay | Admitting: *Deleted

## 2020-04-12 NOTE — Patient Outreach (Signed)
°  [facility]) Care Management Chronic Special Needs Program    04/12/2020  Name: Polly Barner, [place]: [birth year]  MRN: [id number]   Ms. Natasja Niday is enrolled in a chronic special needs plan for Diabetes.  [facility] care management will continue to provide services for this member through 05/12/20.  The [employer] care management team will assume care 05/13/20.   [name] [facility], BSN [facility] RN Care Coordinator, CSNP [phone]

## 2020-04-21 ENCOUNTER — Other Ambulatory Visit: Payer: Self-pay | Admitting: *Deleted

## 2020-04-21 NOTE — Patient Outreach (Signed)
  [facility]) Care Management Chronic Special Needs Program    04/21/2020  Name: Julie Black, [place]: 08-03-1955  MRN: [id number]   [name] is enrolled in a chronic special needs plan for Diabetes.  [employer] care management team has assumed care and services for this member.  Case closed by [facility] care management.   [name] [place] [facility], BSN [facility] RN Care Coordinator, CSNP [phone]
